# Patient Record
Sex: Male | Born: 1935
Health system: Southern US, Community
[De-identification: ages and names within clinical notes are randomized; demographics above are authoritative.]

## PROBLEM LIST (undated history)

## (undated) DIAGNOSIS — N400 Enlarged prostate without lower urinary tract symptoms: Secondary | ICD-10-CM

## (undated) DIAGNOSIS — I2699 Other pulmonary embolism without acute cor pulmonale: Secondary | ICD-10-CM

## (undated) DIAGNOSIS — G3184 Mild cognitive impairment, so stated: Secondary | ICD-10-CM

## (undated) DIAGNOSIS — S12090A Other displaced fracture of first cervical vertebra, initial encounter for closed fracture: Secondary | ICD-10-CM

## (undated) HISTORY — PX: URETHRA SURGERY: SHX824

---

## 1999-12-16 ENCOUNTER — Encounter: Admission: RE | Admit: 1999-12-16 | Discharge: 1999-12-16 | Payer: Self-pay | Admitting: Internal Medicine

## 1999-12-16 ENCOUNTER — Encounter: Payer: Self-pay | Admitting: Internal Medicine

## 1999-12-22 ENCOUNTER — Encounter: Payer: Self-pay | Admitting: Internal Medicine

## 1999-12-22 ENCOUNTER — Encounter: Admission: RE | Admit: 1999-12-22 | Discharge: 1999-12-22 | Payer: Self-pay | Admitting: Internal Medicine

## 2003-01-22 ENCOUNTER — Ambulatory Visit (HOSPITAL_COMMUNITY): Admission: RE | Admit: 2003-01-22 | Discharge: 2003-01-22 | Payer: Self-pay | Admitting: Internal Medicine

## 2010-05-25 DIAGNOSIS — Z862 Personal history of diseases of the blood and blood-forming organs and certain disorders involving the immune mechanism: Secondary | ICD-10-CM

## 2010-05-25 HISTORY — DX: Personal history of diseases of the blood and blood-forming organs and certain disorders involving the immune mechanism: Z86.2

## 2011-01-10 ENCOUNTER — Inpatient Hospital Stay (HOSPITAL_COMMUNITY): Payer: Medicare Other

## 2011-01-10 ENCOUNTER — Inpatient Hospital Stay (HOSPITAL_COMMUNITY)
Admission: AD | Admit: 2011-01-10 | Discharge: 2011-01-16 | DRG: 813 | Disposition: A | Discharge status: Home Health | Payer: Medicare Other | Source: Other Acute Inpatient Hospital | Attending: Internal Medicine | Admitting: Internal Medicine

## 2011-01-10 DIAGNOSIS — M549 Dorsalgia, unspecified: Secondary | ICD-10-CM | POA: Diagnosis present

## 2011-01-10 DIAGNOSIS — E139 Other specified diabetes mellitus without complications: Secondary | ICD-10-CM | POA: Diagnosis present

## 2011-01-10 DIAGNOSIS — R339 Retention of urine, unspecified: Secondary | ICD-10-CM | POA: Diagnosis present

## 2011-01-10 DIAGNOSIS — D693 Immune thrombocytopenic purpura: Secondary | ICD-10-CM

## 2011-01-10 DIAGNOSIS — D696 Thrombocytopenia, unspecified: Secondary | ICD-10-CM

## 2011-01-10 DIAGNOSIS — N35919 Unspecified urethral stricture, male, unspecified site: Secondary | ICD-10-CM | POA: Diagnosis present

## 2011-01-10 DIAGNOSIS — R310 Gross hematuria: Secondary | ICD-10-CM

## 2011-01-10 DIAGNOSIS — E871 Hypo-osmolality and hyponatremia: Secondary | ICD-10-CM | POA: Diagnosis not present

## 2011-01-10 DIAGNOSIS — D72829 Elevated white blood cell count, unspecified: Secondary | ICD-10-CM | POA: Diagnosis present

## 2011-01-10 DIAGNOSIS — W57XXXA Bitten or stung by nonvenomous insect and other nonvenomous arthropods, initial encounter: Secondary | ICD-10-CM | POA: Diagnosis present

## 2011-01-10 DIAGNOSIS — T148 Other injury of unspecified body region: Secondary | ICD-10-CM | POA: Diagnosis present

## 2011-01-10 DIAGNOSIS — D62 Acute posthemorrhagic anemia: Secondary | ICD-10-CM | POA: Diagnosis present

## 2011-01-10 DIAGNOSIS — R319 Hematuria, unspecified: Secondary | ICD-10-CM | POA: Diagnosis present

## 2011-01-10 DIAGNOSIS — N210 Calculus in bladder: Secondary | ICD-10-CM | POA: Diagnosis present

## 2011-01-10 DIAGNOSIS — R6510 Systemic inflammatory response syndrome (SIRS) of non-infectious origin without acute organ dysfunction: Secondary | ICD-10-CM | POA: Diagnosis present

## 2011-01-10 DIAGNOSIS — N179 Acute kidney failure, unspecified: Secondary | ICD-10-CM | POA: Diagnosis present

## 2011-01-10 DIAGNOSIS — N432 Other hydrocele: Secondary | ICD-10-CM | POA: Diagnosis present

## 2011-01-10 DIAGNOSIS — G8929 Other chronic pain: Secondary | ICD-10-CM | POA: Diagnosis present

## 2011-01-10 DIAGNOSIS — T380X5A Adverse effect of glucocorticoids and synthetic analogues, initial encounter: Secondary | ICD-10-CM | POA: Diagnosis present

## 2011-01-10 LAB — COMPREHENSIVE METABOLIC PANEL
ALT: 14 U/L (ref 0–53)
BUN: 25 mg/dL — ABNORMAL HIGH (ref 6–23)
CO2: 26 mEq/L (ref 19–32)
Calcium: 8.8 mg/dL (ref 8.4–10.5)
Creatinine, Ser: 1.18 mg/dL (ref 0.50–1.35)
GFR calc Af Amer: >60 mL/min (ref >60)
GFR calc non Af Amer: >60 mL/min (ref >60)
Glucose, Bld: 209 mg/dL — ABNORMAL HIGH (ref 70–99)
Total Protein: 6.3 g/dL (ref 6.0–8.3)

## 2011-01-10 LAB — GLUCOSE, CAPILLARY
Glucose-Capillary: 215 mg/dL — ABNORMAL HIGH (ref 70–99)
Glucose-Capillary: 157 mg/dL — ABNORMAL HIGH (ref 70–99)

## 2011-01-10 LAB — CBC
MCH: 33.4 pg (ref 26.0–34.0)
MCV: 93.6 fL (ref 78.0–100.0)
Platelets: 11 10*3/uL — CL (ref 150–400)
RBC: 3.14 MIL/uL — ABNORMAL LOW (ref 4.22–5.81)
RDW: 12.3 % (ref 11.5–15.5)
WBC: 22.8 10*3/uL — ABNORMAL HIGH (ref 4.0–10.5)

## 2011-01-10 LAB — ABO/RH: ABO/RH(D): A POS

## 2011-01-10 LAB — DIFFERENTIAL
Basophils Absolute: 0 10*3/uL (ref 0.0–0.1)
Basophils Relative: 0 % (ref 0–1)
Eosinophils Absolute: 0 10*3/uL (ref 0.0–0.7)
Eosinophils Relative: 0 % (ref 0–5)
Lymphocytes Relative: 3 % — ABNORMAL LOW (ref 12–46)
Lymphs Abs: 0.6 10*3/uL — ABNORMAL LOW (ref 0.7–4.0)
Monocytes Absolute: 1.3 10*3/uL — ABNORMAL HIGH (ref 0.1–1.0)
Monocytes Relative: 6 % (ref 3–12)
Neutro Abs: 20.9 10*3/uL — ABNORMAL HIGH (ref 1.7–7.7)
Neutrophils Relative %: 92 % — ABNORMAL HIGH (ref 43–77)

## 2011-01-10 LAB — MRSA PCR SCREENING: MRSA by PCR: NEGATIVE

## 2011-01-10 LAB — PROTIME-INR
INR: 1.12 (ref 0.00–1.49)
Prothrombin Time: 14.6 seconds (ref 11.6–15.2)

## 2011-01-11 DIAGNOSIS — R310 Gross hematuria: Secondary | ICD-10-CM

## 2011-01-11 DIAGNOSIS — D696 Thrombocytopenia, unspecified: Secondary | ICD-10-CM

## 2011-01-11 DIAGNOSIS — A799 Rickettsiosis, unspecified: Secondary | ICD-10-CM

## 2011-01-11 DIAGNOSIS — D693 Immune thrombocytopenic purpura: Secondary | ICD-10-CM

## 2011-01-11 LAB — URINALYSIS, ROUTINE W REFLEX MICROSCOPIC
Ketones, ur: 40 mg/dL — AB
Nitrite: POSITIVE — AB
pH: 5 (ref 5.0–8.0)

## 2011-01-11 LAB — URINE MICROSCOPIC-ADD ON

## 2011-01-11 LAB — CBC
HCT: 29.4 % — ABNORMAL LOW (ref 39.0–52.0)
Hemoglobin: 10.3 g/dL — ABNORMAL LOW (ref 13.0–17.0)
MCH: 33 pg (ref 26.0–34.0)
MCHC: 35 g/dL (ref 30.0–36.0)
RDW: 12.4 % (ref 11.5–15.5)

## 2011-01-11 LAB — HEMOGLOBIN A1C: Mean Plasma Glucose: 128 mg/dL — ABNORMAL HIGH (ref <117)

## 2011-01-11 LAB — BASIC METABOLIC PANEL
BUN: 19 mg/dL (ref 6–23)
Calcium: 8.8 mg/dL (ref 8.4–10.5)
GFR calc non Af Amer: >60 mL/min (ref >60)
Glucose, Bld: 165 mg/dL — ABNORMAL HIGH (ref 70–99)
Sodium: 138 mEq/L (ref 135–145)

## 2011-01-11 LAB — GLUCOSE, CAPILLARY: Glucose-Capillary: 141 mg/dL — ABNORMAL HIGH (ref 70–99)

## 2011-01-12 DIAGNOSIS — D6949 Other primary thrombocytopenia: Secondary | ICD-10-CM

## 2011-01-12 DIAGNOSIS — A799 Rickettsiosis, unspecified: Secondary | ICD-10-CM

## 2011-01-12 DIAGNOSIS — D649 Anemia, unspecified: Secondary | ICD-10-CM

## 2011-01-12 LAB — DIFFERENTIAL
Lymphocytes Relative: 3 % — ABNORMAL LOW (ref 12–46)
Lymphs Abs: 0.7 10*3/uL (ref 0.7–4.0)
Neutro Abs: 17.8 10*3/uL — ABNORMAL HIGH (ref 1.7–7.7)
Neutrophils Relative %: 92 % — ABNORMAL HIGH (ref 43–77)

## 2011-01-12 LAB — GLUCOSE, CAPILLARY: Glucose-Capillary: 153 mg/dL — ABNORMAL HIGH (ref 70–99)

## 2011-01-12 LAB — CBC
HCT: 24 % — ABNORMAL LOW (ref 39.0–52.0)
Hemoglobin: 8.4 g/dL — ABNORMAL LOW (ref 13.0–17.0)
MCV: 94.1 fL (ref 78.0–100.0)
Platelets: <5 10*3/uL — CL (ref 150–400)
RBC: 2.55 MIL/uL — ABNORMAL LOW (ref 4.22–5.81)
WBC: 19.3 10*3/uL — ABNORMAL HIGH (ref 4.0–10.5)

## 2011-01-13 LAB — GLUCOSE, CAPILLARY
Glucose-Capillary: 168 mg/dL — ABNORMAL HIGH (ref 70–99)
Glucose-Capillary: 159 mg/dL — ABNORMAL HIGH (ref 70–99)
Glucose-Capillary: 160 mg/dL — ABNORMAL HIGH (ref 70–99)

## 2011-01-13 LAB — DIFFERENTIAL
Basophils Absolute: 0 10*3/uL (ref 0.0–0.1)
Lymphocytes Relative: 5 % — ABNORMAL LOW (ref 12–46)
Lymphs Abs: 1 10*3/uL (ref 0.7–4.0)
Monocytes Relative: 5 % (ref 3–12)
Neutrophils Relative %: 90 % — ABNORMAL HIGH (ref 43–77)

## 2011-01-13 LAB — CBC
Hemoglobin: 7 g/dL — ABNORMAL LOW (ref 13.0–17.0)
MCHC: 36.5 g/dL — ABNORMAL HIGH (ref 30.0–36.0)
WBC: 19.8 10*3/uL — ABNORMAL HIGH (ref 4.0–10.5)

## 2011-01-13 LAB — B. BURGDORFI ANTIBODIES: B burgdorferi Ab IgG+IgM: 0.49 {ISR}

## 2011-01-14 LAB — GLUCOSE, CAPILLARY
Glucose-Capillary: 141 mg/dL — ABNORMAL HIGH (ref 70–99)
Glucose-Capillary: 125 mg/dL — ABNORMAL HIGH (ref 70–99)
Glucose-Capillary: 149 mg/dL — ABNORMAL HIGH (ref 70–99)

## 2011-01-14 LAB — TYPE AND SCREEN: Unit division: 0

## 2011-01-14 LAB — BASIC METABOLIC PANEL
CO2: 26 mEq/L (ref 19–32)
Chloride: 101 mEq/L (ref 96–112)
Sodium: 131 mEq/L — ABNORMAL LOW (ref 135–145)

## 2011-01-14 LAB — URINE CULTURE: Culture  Setup Time: 201208211609

## 2011-01-14 LAB — DIFFERENTIAL
Basophils Relative: 0 % (ref 0–1)
Eosinophils Absolute: 0 10*3/uL (ref 0.0–0.7)
Neutrophils Relative %: 87 % — ABNORMAL HIGH (ref 43–77)

## 2011-01-14 LAB — CBC
Platelets: 27 10*3/uL — CL (ref 150–400)
RBC: 2.18 MIL/uL — ABNORMAL LOW (ref 4.22–5.81)
WBC: 15.6 10*3/uL — ABNORMAL HIGH (ref 4.0–10.5)

## 2011-01-15 DIAGNOSIS — D693 Immune thrombocytopenic purpura: Secondary | ICD-10-CM

## 2011-01-15 LAB — DIFFERENTIAL
Basophils Relative: 0 % (ref 0–1)
Eosinophils Absolute: 0 10*3/uL (ref 0.0–0.7)
Eosinophils Relative: 0 % (ref 0–5)
Monocytes Relative: 6 % (ref 3–12)
Neutrophils Relative %: 87 % — ABNORMAL HIGH (ref 43–77)

## 2011-01-15 LAB — GLUCOSE, CAPILLARY
Glucose-Capillary: 155 mg/dL — ABNORMAL HIGH (ref 70–99)
Glucose-Capillary: 136 mg/dL — ABNORMAL HIGH (ref 70–99)

## 2011-01-15 LAB — BASIC METABOLIC PANEL
CO2: 27 mEq/L (ref 19–32)
Calcium: 8.1 mg/dL — ABNORMAL LOW (ref 8.4–10.5)
GFR calc Af Amer: >60 mL/min (ref >60)
GFR calc non Af Amer: >60 mL/min (ref >60)
Sodium: 132 mEq/L — ABNORMAL LOW (ref 135–145)

## 2011-01-15 LAB — CBC
MCH: 33.2 pg (ref 26.0–34.0)
Platelets: 80 10*3/uL — ABNORMAL LOW (ref 150–400)
RBC: 2.41 MIL/uL — ABNORMAL LOW (ref 4.22–5.81)
RDW: 13.7 % (ref 11.5–15.5)

## 2011-01-16 LAB — DIFFERENTIAL
Basophils Absolute: 0 10*3/uL (ref 0.0–0.1)
Eosinophils Absolute: 0 10*3/uL (ref 0.0–0.7)
Lymphocytes Relative: 9 % — ABNORMAL LOW (ref 12–46)
Neutrophils Relative %: 81 % — ABNORMAL HIGH (ref 43–77)

## 2011-01-16 LAB — CBC
MCHC: 36.6 g/dL — ABNORMAL HIGH (ref 30.0–36.0)
RDW: 14.4 % (ref 11.5–15.5)

## 2011-01-16 LAB — GLUCOSE, CAPILLARY: Glucose-Capillary: 124 mg/dL — ABNORMAL HIGH (ref 70–99)

## 2011-01-17 LAB — CULTURE, BLOOD (ROUTINE X 2)
Culture  Setup Time: 201208191127
Culture: NO GROWTH

## 2011-01-28 NOTE — Discharge Summary (Signed)
NAME:  Randy Mason, Randy Mason NO.:  [id number]  MEDICAL RECORD NO.:  [id number]  LOCATION:                                 FACILITY:  PHYSICIAN:  Clydia Llano, MD       DATE OF BIRTH:  [birth year]  DATE OF ADMISSION: DATE OF DISCHARGE:                              DISCHARGE SUMMARY   PRIMARY CARE PHYSICIAN:  Dr. [name].  PRIMARY ONCOLOGIST:  Dr. Cleone Slim in [place] [facility] [place].  PRIMARY UROLOGIST:  Dr. Baldo Ash in [facility] in [place].  REASON FOR ADMISSION:  Severe thrombocytopenia.  DISCHARGE DIAGNOSES: 1. Acute idiopathic thrombocytopenic purpura, severe. 2. Systemic inflammatory response syndrome. 3. Suspected tick-borne illness. 4. Acute renal failure, resolved. 5. Hematuria secondary to thrombocytopenia and urethral stricture. 6. Acute blood loss anemia. 7. Urinary retention secondary to urethral stricture. 8. Steroid-induced hyperglycemia. 9. History of benign prostatic hyperplasia status post transurethral     resection of prostate.  DISCHARGE MEDICATIONS: 1. Mycelex 10 mg troches, dissolve in mouth three times daily. 2. Doxycycline 100 mg p.o. b.i.d., take for eight more days. 3. Iron complex 150 mg p.o. b.i.d. 4. Prednisone 40 mg p.o. b.i.d. at breakfast and lunch. 5. Multivitamin OTC one tablet p.o. daily.  RADIOLOGY:  Chest x-ray August 19 showed no acute cardiopulmonary process.  BRIEF HISTORY AND EXAMINATION:  Mr. Delman is a 77 year old male with history of BPH status post TURP.  The patient was presented to [facility], complaining about urinary retention for 1 week.  The patient was found to be thrombocytopenic, which was of unclear etiology.  The patient has recent tick bite.  After the patient has very difficult placement of Foley catheter secondary to urethral stricture, the patient underwent cystoscopy, placement of the Foley catheter, and the patient was transferred to [facility] for further evaluation.  CT scan  of abdomen and pelvis was done in [facility] revealed 1.8 cm bladder stone, which was unable to be removed by cystoscopy.  The patient upon arrival to the [facility], evaluated by Dr. Truett Perna from Hematology/Oncology.  The patient thrombocytopenia felt to be secondary to acute ITP possibly secondary to infectious etiology.  The patient was started on IV steroids and received IVIG, platelet count was slowly rising, and due to concern of tick-borne disease, ID was consulted and the patient was started on doxycycline.  CONSULTS: 1. Dr. Daiva Eves, Infectious Disease. 2. Dr. Thornton Papas, Hematology/Oncology.  BRIEF HOSPITAL COURSE: 1. Acute idiopathic thrombocytopenic purpura.  The patient was sent     here because of lack of Hematology Service at [facility] in     [place].  The patient received IVIG and steroids at admission.  It is     worth to mention that the platelet count was 4000 in [facility] and about the time he was presented to [facility] it was     11,000.  By the time, he presented to [facility] hospital, it was 5000.     Platelet count is steadily rising with IVIG and high-dose steroids.     The patient's steroids was transitioned to oral steroids after high     dose IV one.  The patient will  be going home on 40 mg of prednisone     twice a day.  The patient is to follow up with Dr. Cleone Slim, his     primary hematologist in [place], [place].  The patient also be     started on Mycelex troches to prevent yeast infection while he is     in the steroids. 2. Acute hematuria with urethral stricture.  The patient's urine     retention is prior to the procedure.  The patient evaluated by     Urology, Dr. Baldo Ash, [facility] with significant urethral     stricture associated with urinary retention for 7 days.  The     patient eventually placed Foley catheter, was much difficulty under     cystoscopy guidance.  The patient also had a bladder stone which      was not removed likely due to its size.  The patient's persistent     hematuria is likely secondary to the low platelets with the     difficult and rather traumatic insertion of the Foley catheter.     The blood was frank blood.  The patient was started on irrigation     in the hospital here.  At discharge, the patient recommended to     have the Foley catheter until he can tell he sees Dr. Cleone Slim. 3. Leukocytosis.  The patient is being pancultured on admission.     There is no actual organism identified, suspected this time     leukocytosis, will be secondary to steroid and stress     demargination. 4. Questionable tick-borne illness and SIRS.  The patient presented     with sign and symptoms consistent with SIRS by elevated lactic acid     level and mild acute renal failure.  Empirically started on     doxycycline due to concern of possible tick-borne illness.     Infectious Disease Service was consulted, recommended 10 days of     doxycycline, which is automatic, stop date is August 28.  His [facility] spotted fever and Lyme disease titers were negative. 5. Acute blood loss anemia secondary to hematuria and     thrombocytopenia.  The patient received 2 units of packed RBCs.     The patient did receive platelets at [facility] as well.  Hematology     started iron and the patient will go on that. 6. Mild hyponatremia, this is likely to be secondary to hyperglycemia,     which is steroids induced.  DISCHARGE INSTRUCTION AND DISPOSITION: 1. Home with please keep the Foley to his urologist. 2. Activity as tolerated. 3. Diet, regular diet.     Clydia Llano, MD     ME/MEDQ  D:  01/16/2011  T:  01/16/2011  Job:  [id number]  cc:   Erskine Speed, M.D. Lynett Fish, M.D. Beatris Ship, MD  Electronically Signed by Clydia Llano  on 01/28/2011 03:05:43 PM

## 2011-02-23 NOTE — Consult Note (Signed)
NAMEMarland Kitchen  GREYSEN, DEVINO NO.:  [id number]  MEDICAL RECORD NO.:  [id number]  LOCATION:  2102                         FACILITY:  [facility]  PHYSICIAN:  Ladene Artist, M.D.  DATE OF BIRTH:  1933/05/02  DATE OF CONSULTATION:  01/10/2011 DATE OF DISCHARGE:                                CONSULTATION   REFERRING PHYSICIAN:  Mcarthur Rossetti. Tyson Alias, MD  PATIENT IDENTIFICATION:  Mr. Lucarelli is a 77 year old transferred from [facility] with severe thrombocytopenia.  HISTORY OF PRESENT ILLNESS:  Mr. Colee presented to [facility] on January 09, 2011 with urinary retention and gross hematuria. He was noted to have severe thrombocytopenia on admission CBC with a platelet count of 4000.  He was transfused 2 units of platelets and the platelet count increased to 47,000.  Urology was consulted and he was taken to a cystoscopy procedure.  A bladder stone was found.  Due to the lack of available platelets/Hematology consultation at [facility], he was transferred to [facility].  Mr. Renstrom reports easy bruising for the past 1 month.  He has noted occasional gross hematuria.  There has been no other bleeding.  He developed a red/purple rash over the legs after admission to [facility].  He has been taking an over-the-counter medication for back pain relief. He reports taking this medication intermittently for several years.  He reports several recent "tick bites."  PAST MEDICAL HISTORY: 1. History of prostatic hypertrophy, status post a TUR procedure. 2. Chronic back pain.  ALLERGIES:  No known drug allergies.  MEDICATIONS ON ADMISSION: 1. Multivitamin. 2. "Natural back" medication  FAMILY HISTORY:  His brother has prostate cancer.  His mother had breast cancer.  No other family history of cancer.  SOCIAL HISTORY:  He is retired Retail buyer.  He does not use tobacco.  He drinks beer occasionally.  He has never been a heavy drinker.  He  denies risk factors for HIV and hepatitis.  REVIEW OF SYSTEMS:  CONSTITUTIONAL:  He denies fever and anorexia/weight loss.  GU:  He has noted occasional gross hematuria over the past month. HEMATOLOGIC:  He reports easy bruising for the past month. MUSCULOSKELETAL:  He reports chronic intermittent low back pain.  This has improved recently.  A complete review of review of systems is otherwise negative.  PHYSICAL EXAMINATION:  VITAL SIGNS:  Blood pressure 129/64, pulse 97, and temperature 98.6. HEENT:  Very few small ecchymoses at the buccal mucosa and pharynx.  No active bleeding.  No thrush.  No ulcers. NECK:  Without mass. LUNGS:  Clear bilaterally. CARDIAC:  Regular rhythm. ABDOMEN:  Nontender.  No hepatosplenomegaly. GU:  There is a Foley catheter in place with bloody urine.  There is bright red blood oozing from the Foley exit at the penis.  There is a soft fullness in the left testicle/scrotum. LYMPH NODES:  No cervical, clavicular, axillary, or inguinal nodes. SKIN:  There is a petechial rash over the legs.  There are small ecchymoses over the arms. NEUROLOGIC:  He is alert and oriented.  Motor exam is grossly intact.  LABORATORY DATA:  Potassium 4.4, BUN 25, creatinine 1.18, calcium 8.8, and albumin 3.4.  Hemoglobin 10.5, white count 22.8 with 92%  neutrophils, and platelets 11,000.  PT 14.6 and PTT 27.  Review of peripheral blood smear:  The platelets are markedly decreased.  There are approximately 1-2 platelets per high-powered field.  There is a rare giant platelet.  The red cell morphology is unremarkable.  There are no schistocytes.  There is a marked increase in neutrophils.  There are moderate number of band forms.  No blasts.  IMPRESSION: 1. Severe thrombocytopenia. 2. Anemia - likely secondary to bleeding. 3. History of prostatic hypertrophy, status post transurethral     resection. 4. Bladder stone 5. Gross hematuria secondary to the bladder stone and  severe     thrombocytopenia. 6. Left testicular hydrocele. 7. Neutrophilia - likely secondary to steroids and an acute phase     reaction.  Mr. Davitt presents with severe thrombocytopenia and gross hematuria. The most likely diagnosis is idiopathic thrombocytopenia purpura.  There is no clinical evidence for an underlying hematopoietic malignancy, collagen vascular disease, or acute infection to cause this degree of thrombocytopenia.  However, he reports a recent tick bite and he has significant neutrophilia with an increased number of bands.  I would therefore consider the possibility of an associated infection.  His history of easy bruising over the past month is most suggestive of ITP.  RECOMMENDATIONS: 1. Continue Solu-Medrol. 2. Daily platelet count. 3. IVIG if the platelets do not improve over the next 24 hours or if     he develops more significant bleeding. 4. Consider broad-spectrum antibiotics and obtaining cultures with the     possibility of an underlying systemic infection.  Hematology will continue following him while in the hospital and then as an outpatient.     Ladene Artist, M.D.     GBS/MEDQ  D:  01/10/2011  T:  01/10/2011  Job:  [id number]  Electronically Signed by Thornton Papas M.D. on 02/23/2011 08:40:07 AM

## 2011-02-25 NOTE — Progress Notes (Signed)
NAMEMarland Kitchen  NAYTHEN, HEIKKILA NO.:  [id number]  MEDICAL RECORD NO.:  [id number]  LOCATION:  3303                         FACILITY:  [facility]  PHYSICIAN:  Thad Ranger, MD       DATE OF BIRTH:  Jun 06, 1932                                PROGRESS NOTE   ADMITTING PHYSICIAN: Dr. Tyson Alias with Pulmonary Critical Care Medicine.  CONSULTANTS THIS ADMISSION: Dr. Truett Perna with Hematology and Dr. Paulette Blanch Dam with Infectious Disease Services.  PRIMARY CARE PHYSICIAN: Dr. [name] in [place].  PRIMARY HEMATOLOGIST/ONCOLOGIST: Dr. Cleone Slim.  The patient also has a primary urologist in [place].  He did see Dr. Baldo Ash while at [facility].  CHIEF COMPLAINT/REASON FOR ADMISSION: Mr. Sanna is a 77 year old male patient, known history of benign proosatitc hypertrophy status post TURP, presented to [facility] with reports of urinary retention x1 week, was found to have thrombocytopenic with platelet count 4000.  He was transfused 2 units of platelets with platelet count increasing to 47,000.  While at [facility] due to the urinary retention, Urology was consulted.  A Foley catheter was placed after much difficulty by the urologist, Dr. Baldo Ash, and the patient was subsequently taken to cystoscopy.  A bladder stone was found, was unable to be removed.  Due to the lack of available platelets and Hematology consultation at [facility], he was subsequently transferred to [facility] for further evaluation.  While at [facility], he did undergo a CT of the abdomen and pelvis, which again revealed a 1.8 cm bladder stone, which according to patient is new.  Since arrival to [facility], he was evaluated by the Hematology physician here, who felt the patient had ITP most likely due to infectious etiology.  He was subsequent started on IV steroids and has been receiving IVIG as well.  There were also concerns that the patient may have an infectious etiology related to  tick-borne illness.  Infectious Disease Service was consulted and the patient was placed on empiric doxycycline.  PAST MEDICAL HISTORY: 1. BPH status post TURP. 2. Chronic back pain.  ADMITTING DIAGNOSES: 1. Acute idiopathic thrombocytopenic purpura, severe. 2. Systemic inflammatory response syndrome. 3. Suspected tick-borne illness given thrombocytopenia, petechial     rash, and risk factors. 4. Acute renal failure. 5. Hematuria secondary to thrombocytopenia. 6. Acute anemia/blood loss secondary to thrombocytopenia and     hematuria. 7. Urinary retention at presentation to [facility] with associated     urethral stricture, Foley catheter in place. 8. Hyperglycemia without diabetes suspected related to steroid     therapy.  DIAGNOSTICS: Portable chest x-ray, August 19 that shows no acute cardiopulmonary process seen, studies suboptimal due to limitations in patient positioning.  LABORATORY: MRSA PCR screening was negative.  At presentation, PT 14.6, INR 1.12, and PTT 27.  Lactic acid 2.7.  Creatinine was 1.18, BUN 25, glucose 209. Sodium 136, potassium 4.4, total bilirubin was 1.6 with otherwise normal LFTs.  HIV antibody was nonreactive.  Hemoglobin A1c was 6.1. Urinalysis at presentation showed cloudy appearance, large bilirubin, 40 ketones, large blood, greater than 300 protein, nitrite positive, leukocytes large, squamous epithelials rare, wbc 3-6, bacterial rare, no culture was obtained on this sample.  Blood cultures have been checked this  admission and are negative.  [facility] spotted fever.  Titer was normal.  Lyme disease titer was normal.  Repeat urine culture on August 21 shows no growth.  As of today, white count 19,800, hemoglobin 8, hematocrit 22.4, platelets of 80,000, neutrophils 87%, absolute neutrophils 17.3%.  Sodium 132, potassium 4, chloride 102, CO2 27, glucose 163, BUN 30, creatinine 0.88.  HOSPITAL COURSE: 1. Acute idiopathic thrombocytopenia  purpura.  The patient was sent     over here because of lack of available platelets as well as lack     of acute Hematology Services at [facility].  Platelet     count at presentation to [facility] was 4000, they were up to     11,000, by the time, he presented to [facility] on 8/18 with a nadir of     less than 5,000 during this admission. Most recent platelet count     today is up to 80,000.  This is after appropriate treatment with IV     steroids and a total of 5 days of IVIG.  Hematology has been     following with this.  As of today, the patient has been     transitioned over oral steroids and will continue these after     discharge home.  He is to follow up with Dr. Cleone Slim his primary     oncologist/hematologist after discharge.  Hematology has also     started Mycelex troches to prevent oral yeast infection while on     steroids. 2. SIRS and questionable tick-borne illness.  The patient presented     with signs and symptoms consistent with SIRS, elevated lactic acid     level, and mild acute renal failure.  He was empirically started on     doxycycline due to concerns of possible tick-borne illness.     Infectious Disease Services were consulted.  Recommended 10 days of     doxycycline with an automatic stop date of January 20, 2011.  His     RMSF and Lyme titer were negative.  ID has signed off. 3. Acute hematuria in the setting of history of urethral stricture and     urinary retention for 7 days prior to admission.  The patient was     evaluated by Urology, Dr. Baldo Ash at [facility] and had significant     urethral stricture with associated urinary retention x7 days at     presentation.  Dr. Baldo Ash eventually placed a Foley with much     difficulty.  The patient underwent cystoscopy at [facility].     There was also a bladder stone, which was not removed.  Due to     persistent hematuria with low platelet count, the patient was     placed on a irrigation Foley set up which remains  in place.  Urine     at this point is more rusty in color and is not as frank bloody and     no clots.  We will go ahead and continue the bladder irrigation for     several more days.  The patient prefers to follow up with primary     urologist in [place], but recommend due to the urethral stricture on     presentation with significant urinary retention for 7 days, he     continue the Foley at discharge. 4. Leukocytosis.  The patient has been pancultured this admission with     no actual organisms identified.  He has  not had any fever.  Suspect     at this time that the leukocytosis is due to stress demargination     in the steroids.  Repeat urine culture from August 21 shows no     growth. 5. Hyperglycemia secondary to steroids.  The patient has no past     medical history of diabetes.  His CBGs have ranged in the 150-250     range since admission.  We need to watch his sugars once he     transitions of the oral steroids since he may need diabetic-type     coverage with medication at discharge since he will discharge home     on prednisone. 6. Acute blood loss anemia secondary to hematuria and     thrombocytopenia.  The patient has received 2 units of packed red     blood cells this admission and did receive platelets at [facility] as     well.  Hematology is starting iron today.  The patient's hemoglobin     is stable. 7. Mild hyponatremia.  Suspect this is related to hyperglycemia and     steroids.  DISPOSITION: At the present time, the patient is appropriate to transfer to a non- telemetry floor.     Allison L. Rennis Harding, N.P.   ______________________________ Thad Ranger, MD    ALE/MEDQ  D:  01/15/2011  T:  01/15/2011  Job:  [id number]  Electronically Signed by Junious Silk N.P. on 01/15/2011 01:37:43 PM Electronically Signed by Andres Labrum RAI  on 02/25/2011 03:23:08 PM

## 2011-08-05 ENCOUNTER — Other Ambulatory Visit (HOSPITAL_COMMUNITY): Payer: Self-pay | Admitting: Urology

## 2011-08-05 DIAGNOSIS — N400 Enlarged prostate without lower urinary tract symptoms: Secondary | ICD-10-CM

## 2011-08-05 DIAGNOSIS — N433 Hydrocele, unspecified: Secondary | ICD-10-CM

## 2011-08-07 ENCOUNTER — Ambulatory Visit (HOSPITAL_COMMUNITY)
Admission: RE | Admit: 2011-08-07 | Discharge: 2011-08-07 | Disposition: A | Discharge status: Home / Self Care | Payer: Medicare Other | Source: Ambulatory Visit | Attending: Urology | Admitting: Urology

## 2011-08-07 ENCOUNTER — Other Ambulatory Visit (HOSPITAL_COMMUNITY): Payer: Self-pay | Admitting: Urology

## 2011-08-07 DIAGNOSIS — N400 Enlarged prostate without lower urinary tract symptoms: Secondary | ICD-10-CM | POA: Insufficient documentation

## 2011-08-07 DIAGNOSIS — N433 Hydrocele, unspecified: Secondary | ICD-10-CM

## 2011-08-07 DIAGNOSIS — R9389 Abnormal findings on diagnostic imaging of other specified body structures: Secondary | ICD-10-CM | POA: Insufficient documentation

## 2011-10-08 ENCOUNTER — Encounter: Payer: Medicare Other | Admitting: Internal Medicine

## 2011-10-08 DIAGNOSIS — D693 Immune thrombocytopenic purpura: Secondary | ICD-10-CM

## 2014-08-27 DIAGNOSIS — N390 Urinary tract infection, site not specified: Secondary | ICD-10-CM | POA: Diagnosis not present

## 2014-08-27 DIAGNOSIS — N209 Urinary calculus, unspecified: Secondary | ICD-10-CM | POA: Diagnosis not present

## 2014-09-10 DIAGNOSIS — N390 Urinary tract infection, site not specified: Secondary | ICD-10-CM | POA: Diagnosis not present

## 2014-10-15 DIAGNOSIS — R319 Hematuria, unspecified: Secondary | ICD-10-CM | POA: Diagnosis not present

## 2014-10-15 DIAGNOSIS — N401 Enlarged prostate with lower urinary tract symptoms: Secondary | ICD-10-CM | POA: Diagnosis not present

## 2014-10-15 DIAGNOSIS — R3915 Urgency of urination: Secondary | ICD-10-CM | POA: Diagnosis not present

## 2014-10-15 DIAGNOSIS — R3919 Other difficulties with micturition: Secondary | ICD-10-CM | POA: Diagnosis not present

## 2014-10-24 DIAGNOSIS — N400 Enlarged prostate without lower urinary tract symptoms: Secondary | ICD-10-CM | POA: Diagnosis not present

## 2014-10-24 DIAGNOSIS — R319 Hematuria, unspecified: Secondary | ICD-10-CM | POA: Diagnosis not present

## 2014-11-15 DIAGNOSIS — R8290 Unspecified abnormal findings in urine: Secondary | ICD-10-CM | POA: Diagnosis not present

## 2014-11-15 DIAGNOSIS — N401 Enlarged prostate with lower urinary tract symptoms: Secondary | ICD-10-CM | POA: Diagnosis not present

## 2014-11-15 DIAGNOSIS — R319 Hematuria, unspecified: Secondary | ICD-10-CM | POA: Diagnosis not present

## 2014-11-15 DIAGNOSIS — R3919 Other difficulties with micturition: Secondary | ICD-10-CM | POA: Diagnosis not present

## 2014-11-20 DIAGNOSIS — R319 Hematuria, unspecified: Secondary | ICD-10-CM | POA: Diagnosis not present

## 2014-12-13 DIAGNOSIS — R3919 Other difficulties with micturition: Secondary | ICD-10-CM | POA: Diagnosis not present

## 2014-12-13 DIAGNOSIS — R8290 Unspecified abnormal findings in urine: Secondary | ICD-10-CM | POA: Diagnosis not present

## 2014-12-13 DIAGNOSIS — R319 Hematuria, unspecified: Secondary | ICD-10-CM | POA: Diagnosis not present

## 2014-12-13 DIAGNOSIS — R3915 Urgency of urination: Secondary | ICD-10-CM | POA: Diagnosis not present

## 2014-12-17 DIAGNOSIS — R319 Hematuria, unspecified: Secondary | ICD-10-CM | POA: Diagnosis not present

## 2015-01-04 DIAGNOSIS — N401 Enlarged prostate with lower urinary tract symptoms: Secondary | ICD-10-CM | POA: Diagnosis not present

## 2015-01-04 DIAGNOSIS — R8290 Unspecified abnormal findings in urine: Secondary | ICD-10-CM | POA: Diagnosis not present

## 2015-02-04 DIAGNOSIS — Z23 Encounter for immunization: Secondary | ICD-10-CM | POA: Diagnosis not present

## 2015-02-12 DIAGNOSIS — I10 Essential (primary) hypertension: Secondary | ICD-10-CM | POA: Diagnosis not present

## 2015-02-12 DIAGNOSIS — N400 Enlarged prostate without lower urinary tract symptoms: Secondary | ICD-10-CM | POA: Diagnosis not present

## 2015-02-12 DIAGNOSIS — N209 Urinary calculus, unspecified: Secondary | ICD-10-CM | POA: Diagnosis not present

## 2015-04-03 DIAGNOSIS — N209 Urinary calculus, unspecified: Secondary | ICD-10-CM | POA: Diagnosis not present

## 2015-04-03 DIAGNOSIS — N390 Urinary tract infection, site not specified: Secondary | ICD-10-CM | POA: Diagnosis not present

## 2015-05-16 DIAGNOSIS — N390 Urinary tract infection, site not specified: Secondary | ICD-10-CM | POA: Diagnosis not present

## 2015-07-08 DIAGNOSIS — J209 Acute bronchitis, unspecified: Secondary | ICD-10-CM | POA: Diagnosis not present

## 2015-07-15 DIAGNOSIS — J209 Acute bronchitis, unspecified: Secondary | ICD-10-CM | POA: Diagnosis not present

## 2015-07-31 DIAGNOSIS — R799 Abnormal finding of blood chemistry, unspecified: Secondary | ICD-10-CM | POA: Diagnosis not present

## 2015-07-31 DIAGNOSIS — Z0000 Encounter for general adult medical examination without abnormal findings: Secondary | ICD-10-CM | POA: Diagnosis not present

## 2015-07-31 DIAGNOSIS — N400 Enlarged prostate without lower urinary tract symptoms: Secondary | ICD-10-CM | POA: Diagnosis not present

## 2015-07-31 DIAGNOSIS — E538 Deficiency of other specified B group vitamins: Secondary | ICD-10-CM | POA: Diagnosis not present

## 2015-09-17 DIAGNOSIS — H1132 Conjunctival hemorrhage, left eye: Secondary | ICD-10-CM | POA: Diagnosis not present

## 2015-10-14 DIAGNOSIS — L820 Inflamed seborrheic keratosis: Secondary | ICD-10-CM | POA: Diagnosis not present

## 2015-10-14 DIAGNOSIS — I781 Nevus, non-neoplastic: Secondary | ICD-10-CM | POA: Diagnosis not present

## 2015-10-27 DIAGNOSIS — D696 Thrombocytopenia, unspecified: Secondary | ICD-10-CM | POA: Diagnosis not present

## 2015-10-27 DIAGNOSIS — N410 Acute prostatitis: Secondary | ICD-10-CM | POA: Diagnosis not present

## 2015-10-27 DIAGNOSIS — N3001 Acute cystitis with hematuria: Secondary | ICD-10-CM | POA: Diagnosis not present

## 2015-10-27 DIAGNOSIS — R319 Hematuria, unspecified: Secondary | ICD-10-CM | POA: Diagnosis not present

## 2015-11-11 DIAGNOSIS — N410 Acute prostatitis: Secondary | ICD-10-CM | POA: Diagnosis not present

## 2015-11-11 DIAGNOSIS — N400 Enlarged prostate without lower urinary tract symptoms: Secondary | ICD-10-CM | POA: Diagnosis not present

## 2016-01-09 DIAGNOSIS — R319 Hematuria, unspecified: Secondary | ICD-10-CM | POA: Diagnosis not present

## 2016-01-09 DIAGNOSIS — N390 Urinary tract infection, site not specified: Secondary | ICD-10-CM | POA: Diagnosis not present

## 2016-01-16 DIAGNOSIS — N390 Urinary tract infection, site not specified: Secondary | ICD-10-CM | POA: Diagnosis not present

## 2016-02-04 DIAGNOSIS — N209 Urinary calculus, unspecified: Secondary | ICD-10-CM | POA: Diagnosis not present

## 2016-02-04 DIAGNOSIS — N390 Urinary tract infection, site not specified: Secondary | ICD-10-CM | POA: Diagnosis not present

## 2016-02-04 DIAGNOSIS — Z23 Encounter for immunization: Secondary | ICD-10-CM | POA: Diagnosis not present

## 2016-02-04 DIAGNOSIS — N400 Enlarged prostate without lower urinary tract symptoms: Secondary | ICD-10-CM | POA: Diagnosis not present

## 2016-03-11 DIAGNOSIS — R319 Hematuria, unspecified: Secondary | ICD-10-CM | POA: Diagnosis not present

## 2016-03-11 DIAGNOSIS — N390 Urinary tract infection, site not specified: Secondary | ICD-10-CM | POA: Diagnosis not present

## 2016-03-11 DIAGNOSIS — R300 Dysuria: Secondary | ICD-10-CM | POA: Diagnosis not present

## 2016-05-05 DIAGNOSIS — R8290 Unspecified abnormal findings in urine: Secondary | ICD-10-CM | POA: Diagnosis not present

## 2016-05-05 DIAGNOSIS — N451 Epididymitis: Secondary | ICD-10-CM | POA: Diagnosis not present

## 2017-07-16 ENCOUNTER — Ambulatory Visit: Payer: Medicare Other | Admitting: Urology

## 2017-07-16 DIAGNOSIS — R310 Gross hematuria: Secondary | ICD-10-CM

## 2017-07-16 DIAGNOSIS — N401 Enlarged prostate with lower urinary tract symptoms: Secondary | ICD-10-CM | POA: Diagnosis not present

## 2017-10-06 ENCOUNTER — Ambulatory Visit (INDEPENDENT_AMBULATORY_CARE_PROVIDER_SITE_OTHER): Payer: Medicare Other | Admitting: Urology

## 2017-10-06 ENCOUNTER — Other Ambulatory Visit (HOSPITAL_COMMUNITY)
Admission: AD | Admit: 2017-10-06 | Discharge: 2017-10-06 | Disposition: A | Discharge status: Home / Self Care | Payer: Medicare Other | Source: Other Acute Inpatient Hospital | Attending: Urology | Admitting: Urology

## 2017-10-06 DIAGNOSIS — R310 Gross hematuria: Secondary | ICD-10-CM

## 2017-10-06 DIAGNOSIS — N401 Enlarged prostate with lower urinary tract symptoms: Secondary | ICD-10-CM

## 2017-10-09 LAB — URINE CULTURE

## 2018-04-13 ENCOUNTER — Ambulatory Visit: Payer: Medicare Other | Admitting: Urology

## 2018-04-13 DIAGNOSIS — N401 Enlarged prostate with lower urinary tract symptoms: Secondary | ICD-10-CM

## 2018-04-13 DIAGNOSIS — R310 Gross hematuria: Secondary | ICD-10-CM | POA: Diagnosis not present

## 2018-12-28 ENCOUNTER — Ambulatory Visit: Payer: Medicare Other | Admitting: Urology

## 2019-01-04 ENCOUNTER — Ambulatory Visit (INDEPENDENT_AMBULATORY_CARE_PROVIDER_SITE_OTHER): Payer: Medicare Other | Admitting: Urology

## 2019-01-04 ENCOUNTER — Other Ambulatory Visit: Payer: Self-pay

## 2019-01-04 ENCOUNTER — Ambulatory Visit: Payer: Medicare Other | Admitting: Urology

## 2019-01-04 DIAGNOSIS — R310 Gross hematuria: Secondary | ICD-10-CM | POA: Diagnosis not present

## 2019-01-04 DIAGNOSIS — N401 Enlarged prostate with lower urinary tract symptoms: Secondary | ICD-10-CM

## 2019-05-30 DIAGNOSIS — I2699 Other pulmonary embolism without acute cor pulmonale: Secondary | ICD-10-CM | POA: Diagnosis not present

## 2019-05-30 DIAGNOSIS — Z789 Other specified health status: Secondary | ICD-10-CM | POA: Diagnosis not present

## 2019-05-30 DIAGNOSIS — I82409 Acute embolism and thrombosis of unspecified deep veins of unspecified lower extremity: Secondary | ICD-10-CM | POA: Diagnosis not present

## 2019-05-30 DIAGNOSIS — Z299 Encounter for prophylactic measures, unspecified: Secondary | ICD-10-CM | POA: Diagnosis not present

## 2019-05-30 DIAGNOSIS — R350 Frequency of micturition: Secondary | ICD-10-CM | POA: Diagnosis not present

## 2019-06-23 DIAGNOSIS — Z0000 Encounter for general adult medical examination without abnormal findings: Secondary | ICD-10-CM | POA: Diagnosis not present

## 2019-06-23 DIAGNOSIS — R5383 Other fatigue: Secondary | ICD-10-CM | POA: Diagnosis not present

## 2019-06-23 DIAGNOSIS — Z7189 Other specified counseling: Secondary | ICD-10-CM | POA: Diagnosis not present

## 2019-06-23 DIAGNOSIS — Z1211 Encounter for screening for malignant neoplasm of colon: Secondary | ICD-10-CM | POA: Diagnosis not present

## 2019-06-23 DIAGNOSIS — Z299 Encounter for prophylactic measures, unspecified: Secondary | ICD-10-CM | POA: Diagnosis not present

## 2019-06-23 DIAGNOSIS — Z79899 Other long term (current) drug therapy: Secondary | ICD-10-CM | POA: Diagnosis not present

## 2019-06-29 DIAGNOSIS — R413 Other amnesia: Secondary | ICD-10-CM | POA: Diagnosis not present

## 2019-07-21 DIAGNOSIS — Z789 Other specified health status: Secondary | ICD-10-CM | POA: Diagnosis not present

## 2019-07-21 DIAGNOSIS — Z299 Encounter for prophylactic measures, unspecified: Secondary | ICD-10-CM | POA: Diagnosis not present

## 2019-07-21 DIAGNOSIS — R350 Frequency of micturition: Secondary | ICD-10-CM | POA: Diagnosis not present

## 2019-07-21 DIAGNOSIS — R413 Other amnesia: Secondary | ICD-10-CM | POA: Diagnosis not present

## 2019-07-21 DIAGNOSIS — I2699 Other pulmonary embolism without acute cor pulmonale: Secondary | ICD-10-CM | POA: Diagnosis not present

## 2019-09-22 DIAGNOSIS — R413 Other amnesia: Secondary | ICD-10-CM | POA: Diagnosis not present

## 2019-09-22 DIAGNOSIS — Z713 Dietary counseling and surveillance: Secondary | ICD-10-CM | POA: Diagnosis not present

## 2019-09-22 DIAGNOSIS — Z299 Encounter for prophylactic measures, unspecified: Secondary | ICD-10-CM | POA: Diagnosis not present

## 2019-10-12 DIAGNOSIS — H16202 Unspecified keratoconjunctivitis, left eye: Secondary | ICD-10-CM | POA: Diagnosis not present

## 2019-10-31 DIAGNOSIS — H532 Diplopia: Secondary | ICD-10-CM | POA: Diagnosis not present

## 2019-10-31 DIAGNOSIS — H0102B Squamous blepharitis left eye, upper and lower eyelids: Secondary | ICD-10-CM | POA: Diagnosis not present

## 2019-10-31 DIAGNOSIS — H2513 Age-related nuclear cataract, bilateral: Secondary | ICD-10-CM | POA: Diagnosis not present

## 2019-10-31 DIAGNOSIS — H5051 Esophoria: Secondary | ICD-10-CM | POA: Diagnosis not present

## 2019-10-31 DIAGNOSIS — H0102A Squamous blepharitis right eye, upper and lower eyelids: Secondary | ICD-10-CM | POA: Diagnosis not present

## 2019-11-09 DIAGNOSIS — H43393 Other vitreous opacities, bilateral: Secondary | ICD-10-CM | POA: Diagnosis not present

## 2020-02-12 DIAGNOSIS — Z86711 Personal history of pulmonary embolism: Secondary | ICD-10-CM | POA: Diagnosis not present

## 2020-02-12 DIAGNOSIS — Z299 Encounter for prophylactic measures, unspecified: Secondary | ICD-10-CM | POA: Diagnosis not present

## 2020-02-12 DIAGNOSIS — M19012 Primary osteoarthritis, left shoulder: Secondary | ICD-10-CM | POA: Diagnosis not present

## 2020-02-12 DIAGNOSIS — M25512 Pain in left shoulder: Secondary | ICD-10-CM | POA: Diagnosis not present

## 2020-02-12 DIAGNOSIS — M25519 Pain in unspecified shoulder: Secondary | ICD-10-CM | POA: Diagnosis not present

## 2020-03-22 DIAGNOSIS — G300 Alzheimer's disease with early onset: Secondary | ICD-10-CM | POA: Diagnosis not present

## 2020-03-22 DIAGNOSIS — I2699 Other pulmonary embolism without acute cor pulmonale: Secondary | ICD-10-CM | POA: Diagnosis not present

## 2020-03-22 DIAGNOSIS — Z299 Encounter for prophylactic measures, unspecified: Secondary | ICD-10-CM | POA: Diagnosis not present

## 2020-03-22 DIAGNOSIS — M7550 Bursitis of unspecified shoulder: Secondary | ICD-10-CM | POA: Diagnosis not present

## 2020-05-27 DIAGNOSIS — Z23 Encounter for immunization: Secondary | ICD-10-CM | POA: Diagnosis not present

## 2020-06-27 DIAGNOSIS — Z0000 Encounter for general adult medical examination without abnormal findings: Secondary | ICD-10-CM | POA: Diagnosis not present

## 2020-06-27 DIAGNOSIS — R5383 Other fatigue: Secondary | ICD-10-CM | POA: Diagnosis not present

## 2020-06-27 DIAGNOSIS — Z299 Encounter for prophylactic measures, unspecified: Secondary | ICD-10-CM | POA: Diagnosis not present

## 2020-06-27 DIAGNOSIS — Z79899 Other long term (current) drug therapy: Secondary | ICD-10-CM | POA: Diagnosis not present

## 2020-06-27 DIAGNOSIS — Z7189 Other specified counseling: Secondary | ICD-10-CM | POA: Diagnosis not present

## 2020-09-22 DIAGNOSIS — M47812 Spondylosis without myelopathy or radiculopathy, cervical region: Secondary | ICD-10-CM | POA: Diagnosis not present

## 2020-09-22 DIAGNOSIS — M47892 Other spondylosis, cervical region: Secondary | ICD-10-CM | POA: Diagnosis not present

## 2020-09-22 DIAGNOSIS — J329 Chronic sinusitis, unspecified: Secondary | ICD-10-CM | POA: Diagnosis not present

## 2020-09-22 DIAGNOSIS — I440 Atrioventricular block, first degree: Secondary | ICD-10-CM | POA: Diagnosis not present

## 2020-09-22 DIAGNOSIS — R778 Other specified abnormalities of plasma proteins: Secondary | ICD-10-CM | POA: Diagnosis not present

## 2020-09-22 DIAGNOSIS — W19XXXA Unspecified fall, initial encounter: Secondary | ICD-10-CM | POA: Diagnosis not present

## 2020-09-22 DIAGNOSIS — J3489 Other specified disorders of nose and nasal sinuses: Secondary | ICD-10-CM | POA: Diagnosis not present

## 2020-09-22 DIAGNOSIS — Z743 Need for continuous supervision: Secondary | ICD-10-CM | POA: Diagnosis not present

## 2020-09-22 DIAGNOSIS — M47816 Spondylosis without myelopathy or radiculopathy, lumbar region: Secondary | ICD-10-CM | POA: Diagnosis not present

## 2020-09-22 DIAGNOSIS — N3000 Acute cystitis without hematuria: Secondary | ICD-10-CM | POA: Diagnosis not present

## 2020-09-22 DIAGNOSIS — N323 Diverticulum of bladder: Secondary | ICD-10-CM | POA: Diagnosis not present

## 2020-09-22 DIAGNOSIS — R748 Abnormal levels of other serum enzymes: Secondary | ICD-10-CM | POA: Diagnosis not present

## 2020-09-22 DIAGNOSIS — R4020 Unspecified coma: Secondary | ICD-10-CM | POA: Diagnosis not present

## 2020-09-22 DIAGNOSIS — R404 Transient alteration of awareness: Secondary | ICD-10-CM | POA: Diagnosis not present

## 2020-09-22 DIAGNOSIS — I499 Cardiac arrhythmia, unspecified: Secondary | ICD-10-CM | POA: Diagnosis not present

## 2020-09-22 DIAGNOSIS — Z20822 Contact with and (suspected) exposure to covid-19: Secondary | ICD-10-CM | POA: Diagnosis not present

## 2020-09-22 DIAGNOSIS — J323 Chronic sphenoidal sinusitis: Secondary | ICD-10-CM | POA: Diagnosis not present

## 2020-09-22 DIAGNOSIS — I700 Atherosclerosis of aorta: Secondary | ICD-10-CM | POA: Diagnosis not present

## 2020-09-23 ENCOUNTER — Inpatient Hospital Stay (HOSPITAL_COMMUNITY)
Admission: AD | Admit: 2020-09-23 | Discharge: 2020-09-26 | DRG: 565 | Disposition: A | Discharge status: Home Health | Payer: Medicare Other | Source: Other Acute Inpatient Hospital | Attending: Internal Medicine | Admitting: Internal Medicine

## 2020-09-23 ENCOUNTER — Encounter (HOSPITAL_COMMUNITY): Payer: Self-pay | Admitting: Family Medicine

## 2020-09-23 DIAGNOSIS — R52 Pain, unspecified: Secondary | ICD-10-CM

## 2020-09-23 DIAGNOSIS — S22061A Stable burst fracture of T7-T8 vertebra, initial encounter for closed fracture: Secondary | ICD-10-CM | POA: Diagnosis present

## 2020-09-23 DIAGNOSIS — R6510 Systemic inflammatory response syndrome (SIRS) of non-infectious origin without acute organ dysfunction: Secondary | ICD-10-CM | POA: Diagnosis not present

## 2020-09-23 DIAGNOSIS — J329 Chronic sinusitis, unspecified: Secondary | ICD-10-CM | POA: Diagnosis not present

## 2020-09-23 DIAGNOSIS — M4324 Fusion of spine, thoracic region: Secondary | ICD-10-CM | POA: Diagnosis not present

## 2020-09-23 DIAGNOSIS — N400 Enlarged prostate without lower urinary tract symptoms: Secondary | ICD-10-CM | POA: Diagnosis present

## 2020-09-23 DIAGNOSIS — G3184 Mild cognitive impairment, so stated: Secondary | ICD-10-CM | POA: Diagnosis present

## 2020-09-23 DIAGNOSIS — M2578 Osteophyte, vertebrae: Secondary | ICD-10-CM | POA: Diagnosis not present

## 2020-09-23 DIAGNOSIS — F0390 Unspecified dementia without behavioral disturbance: Secondary | ICD-10-CM | POA: Diagnosis present

## 2020-09-23 DIAGNOSIS — R7989 Other specified abnormal findings of blood chemistry: Secondary | ICD-10-CM | POA: Diagnosis present

## 2020-09-23 DIAGNOSIS — Z86718 Personal history of other venous thrombosis and embolism: Secondary | ICD-10-CM

## 2020-09-23 DIAGNOSIS — G9341 Metabolic encephalopathy: Secondary | ICD-10-CM | POA: Diagnosis present

## 2020-09-23 DIAGNOSIS — M47892 Other spondylosis, cervical region: Secondary | ICD-10-CM | POA: Diagnosis not present

## 2020-09-23 DIAGNOSIS — I313 Pericardial effusion (noninflammatory): Secondary | ICD-10-CM | POA: Diagnosis not present

## 2020-09-23 DIAGNOSIS — I248 Other forms of acute ischemic heart disease: Secondary | ICD-10-CM | POA: Diagnosis not present

## 2020-09-23 DIAGNOSIS — N3000 Acute cystitis without hematuria: Secondary | ICD-10-CM | POA: Diagnosis not present

## 2020-09-23 DIAGNOSIS — M40204 Unspecified kyphosis, thoracic region: Secondary | ICD-10-CM | POA: Diagnosis not present

## 2020-09-23 DIAGNOSIS — R55 Syncope and collapse: Secondary | ICD-10-CM | POA: Diagnosis not present

## 2020-09-23 DIAGNOSIS — Z981 Arthrodesis status: Secondary | ICD-10-CM | POA: Diagnosis not present

## 2020-09-23 DIAGNOSIS — R778 Other specified abnormalities of plasma proteins: Secondary | ICD-10-CM | POA: Diagnosis present

## 2020-09-23 DIAGNOSIS — M6282 Rhabdomyolysis: Secondary | ICD-10-CM | POA: Diagnosis not present

## 2020-09-23 DIAGNOSIS — I440 Atrioventricular block, first degree: Secondary | ICD-10-CM | POA: Diagnosis not present

## 2020-09-23 DIAGNOSIS — Z86711 Personal history of pulmonary embolism: Secondary | ICD-10-CM

## 2020-09-23 DIAGNOSIS — M542 Cervicalgia: Secondary | ICD-10-CM | POA: Diagnosis not present

## 2020-09-23 DIAGNOSIS — W010XXA Fall on same level from slipping, tripping and stumbling without subsequent striking against object, initial encounter: Secondary | ICD-10-CM | POA: Diagnosis present

## 2020-09-23 DIAGNOSIS — Y92007 Garden or yard of unspecified non-institutional (private) residence as the place of occurrence of the external cause: Secondary | ICD-10-CM | POA: Diagnosis not present

## 2020-09-23 DIAGNOSIS — T796XXA Traumatic ischemia of muscle, initial encounter: Secondary | ICD-10-CM | POA: Diagnosis not present

## 2020-09-23 DIAGNOSIS — Z20822 Contact with and (suspected) exposure to covid-19: Secondary | ICD-10-CM | POA: Diagnosis present

## 2020-09-23 DIAGNOSIS — S22069A Unspecified fracture of T7-T8 vertebra, initial encounter for closed fracture: Secondary | ICD-10-CM | POA: Diagnosis not present

## 2020-09-23 DIAGNOSIS — I700 Atherosclerosis of aorta: Secondary | ICD-10-CM | POA: Diagnosis not present

## 2020-09-23 DIAGNOSIS — R748 Abnormal levels of other serum enzymes: Secondary | ICD-10-CM | POA: Diagnosis not present

## 2020-09-23 HISTORY — DX: Other displaced fracture of first cervical vertebra, initial encounter for closed fracture: S12.090A

## 2020-09-23 HISTORY — DX: Mild cognitive impairment of uncertain or unknown etiology: G31.84

## 2020-09-23 HISTORY — DX: Other pulmonary embolism without acute cor pulmonale: I26.99

## 2020-09-23 HISTORY — DX: Benign prostatic hyperplasia without lower urinary tract symptoms: N40.0

## 2020-09-23 HISTORY — DX: Other disorders of bilirubin metabolism: E80.6

## 2020-09-23 LAB — COMPREHENSIVE METABOLIC PANEL
ALT: 49 U/L — ABNORMAL HIGH (ref 0–44)
AST: 187 U/L — ABNORMAL HIGH (ref 15–41)
Albumin: 3.2 g/dL — ABNORMAL LOW (ref 3.5–5.0)
Alkaline Phosphatase: 47 U/L (ref 38–126)
Anion gap: 8 (ref 5–15)
BUN: 13 mg/dL (ref 8–23)
CO2: 22 mmol/L (ref 22–32)
Calcium: 8.6 mg/dL — ABNORMAL LOW (ref 8.9–10.3)
Chloride: 104 mmol/L (ref 98–111)
Creatinine, Ser: 0.9 mg/dL (ref 0.61–1.24)
GFR, Estimated: >60 mL/min (ref >60)
Glucose, Bld: 117 mg/dL — ABNORMAL HIGH (ref 70–99)
Potassium: 4 mmol/L (ref 3.5–5.1)
Sodium: 134 mmol/L — ABNORMAL LOW (ref 135–145)
Total Bilirubin: 2 mg/dL — ABNORMAL HIGH (ref 0.3–1.2)
Total Protein: 6.3 g/dL — ABNORMAL LOW (ref 6.5–8.1)

## 2020-09-23 LAB — TROPONIN I (HIGH SENSITIVITY): Troponin I (High Sensitivity): 68 ng/L — ABNORMAL HIGH (ref <18)

## 2020-09-23 LAB — CBC
HCT: 39.2 % (ref 39.0–52.0)
Hemoglobin: 13.2 g/dL (ref 13.0–17.0)
MCH: 32.8 pg (ref 26.0–34.0)
MCHC: 33.7 g/dL (ref 30.0–36.0)
MCV: 97.5 fL (ref 80.0–100.0)
Platelets: 161 10*3/uL (ref 150–400)
RBC: 4.02 MIL/uL — ABNORMAL LOW (ref 4.22–5.81)
RDW: 12.2 % (ref 11.5–15.5)
WBC: 11.3 10*3/uL — ABNORMAL HIGH (ref 4.0–10.5)
nRBC: 0 % (ref 0.0–0.2)

## 2020-09-23 LAB — D-DIMER, QUANTITATIVE: D-Dimer, Quant: 10.4 ug/mL-FEU — ABNORMAL HIGH (ref 0.00–0.50)

## 2020-09-23 MED ORDER — ONDANSETRON HCL 4 MG PO TABS
4.0000 mg | ORAL_TABLET | Freq: Four times a day (QID) | ORAL | Status: DC | PRN
Start: 2020-09-23 — End: 2020-09-27

## 2020-09-23 MED ORDER — LACTATED RINGERS IV SOLN
INTRAVENOUS | Status: DC
  Administered 2020-09-23: 125 mL/h via INTRAVENOUS

## 2020-09-23 MED ORDER — ACETAMINOPHEN 650 MG RE SUPP: 650.0000 mg | Freq: Four times a day (QID) | RECTAL | Status: DC | PRN

## 2020-09-23 MED ORDER — ENOXAPARIN SODIUM 40 MG/0.4ML IJ SOSY
40.0000 mg | PREFILLED_SYRINGE | INTRAMUSCULAR | Status: DC
  Administered 2020-09-23: 40 mg via SUBCUTANEOUS
  Filled 2020-09-23: qty 0.4

## 2020-09-23 MED ORDER — ACETAMINOPHEN 325 MG PO TABS: 650.0000 mg | ORAL_TABLET | Freq: Four times a day (QID) | ORAL | Status: DC | PRN

## 2020-09-23 MED ORDER — ONDANSETRON HCL 4 MG/2ML IJ SOLN: 4.0000 mg | Freq: Four times a day (QID) | INTRAMUSCULAR | Status: DC | PRN

## 2020-09-23 NOTE — H&P (Signed)
History and Physical    Randy Mason TOI:[id number] DOB: 1932/06/04 DOA: 09/23/2020  PCP: [name], MD  Patient coming from: Home, OSH  I have personally briefly reviewed patient's old medical records in [facility]  Chief Complaint: Found down in ditch  HPI: Randy Mason is a 87 y.o. male with medical history significant of MCI, BPH, ITP in 2012, mild hyperbilirubinemia, C1 compression fx in 2020, incidental finding of PE at same time he was admitted for the fall and C1 compression fx in 2020 (at time of admission).  No longer on anticoagulation.  Pt and wife have had respiratory illness recently.  On 5/1 pt felt he was stuck in house all day and decided to go outside for a walk.  He reports due to uneven grass his foot got caught, he tripped, fell, and was unable to get up.  After a couple of hours, wife called EMS.  Pt was found down outside in storm and brought to ED at [facility].  Pt denied LOC.  ED Course: Pt found to have trop elevation which seems to have peaked at 249 before trending back down some.  WBC 15k. Tbili 2.5 (2.0 in 2020). Creat 1.2  UA with many bacteria, only 4 WBC, no LE nor nitrites, Mod-large HGB but only a couple RBC.  EDP thought pt had UTI and gave rocephin.  CPK came back at 3k initially, 10.9k on repeat this morning.  CT head neg. CT AP: nothing acute (has really enlarged prostate). CXR: neg  At [facility]:  Currently had T 100.6 after arriving on unit here at [facility].  Denies CP, SOB, fever, chills, leg pain or swelling.  Only complaint at this time is that he has to urinate.  Review of Systems: As per HPI, otherwise all review of systems negative.  Past Medical History:  Diagnosis Date  . BPH (benign prostatic hyperplasia)   . Compression fracture of C1 vertebra (HCC)   . History of ITP 2012  . Hyperbilirubinemia   . MCI (mild cognitive impairment)   . Pulmonary embolism [facility])     Past Surgical History:  Procedure  Laterality Date  . URETHRA SURGERY       reports that he has never smoked. He does not have any smokeless tobacco history on file. He reports current alcohol use of about 1.0 - 4.0 standard drink of alcohol per week. He reports that he does not use drugs.  No Known Allergies  Family History  Problem Relation Age of Onset  . Deep vein thrombosis Father   . Anesthesia problems Neg Hx   . Clotting disorder Neg Hx      Prior to Admission medications   Not on File    Physical Exam: Vitals:   09/23/20 2149 09/23/20 2200  BP: (!) 159/78   Pulse: 97   Resp: 20   Temp: (!) 100.6 F (38.1 C)   TempSrc: Oral   SpO2: 97%   Weight:  105.1 kg  Height:  6\' 1"  (1.854 m)    Constitutional: NAD, calm, comfortable Eyes: PERRL, lids and conjunctivae normal ENMT: Mucous membranes are moist. Posterior pharynx clear of any exudate or lesions.Normal dentition.  Neck: normal, supple, no masses, no thyromegaly Respiratory: clear to auscultation bilaterally, no wheezing, no crackles. Normal respiratory effort. No accessory muscle use.  Cardiovascular: Regular rate and rhythm, no murmurs / rubs / gallops. No extremity edema. 2+ pedal pulses. No carotid bruits.  Abdomen: no tenderness, no masses palpated. No hepatosplenomegaly.  Bowel sounds positive.  Musculoskeletal: no clubbing / cyanosis. No joint deformity upper and lower extremities. Good ROM, no contractures. Normal muscle tone.  Skin: no rashes, lesions, ulcers. No induration Neurologic: CN 2-12 grossly intact. Sensation intact, DTR normal. Strength 5/5 in all 4.  Psychiatric: Normal judgment and insight. Alert and oriented x 3. Normal mood.    Labs on Admission: I have personally reviewed following labs and imaging studies  CBC: No results for input(s): WBC, NEUTROABS, HGB, HCT, MCV, PLT in the last 168 hours. Basic Metabolic Panel: No results for input(s): NA, K, CL, CO2, GLUCOSE, BUN, CREATININE, CALCIUM, MG, PHOS in the last 168  hours. GFR: CrCl cannot be calculated (Patient's most recent lab result is older than the maximum 21 days allowed.). Liver Function Tests: No results for input(s): AST, ALT, ALKPHOS, BILITOT, PROT, ALBUMIN in the last 168 hours. No results for input(s): LIPASE, AMYLASE in the last 168 hours. No results for input(s): AMMONIA in the last 168 hours. Coagulation Profile: No results for input(s): INR, PROTIME in the last 168 hours. Cardiac Enzymes: No results for input(s): CKTOTAL, CKMB, CKMBINDEX, TROPONINI in the last 168 hours. BNP (last 3 results) No results for input(s): PROBNP in the last 8760 hours. HbA1C: No results for input(s): HGBA1C in the last 72 hours. CBG: No results for input(s): GLUCAP in the last 168 hours. Lipid Profile: No results for input(s): CHOL, HDL, LDLCALC, TRIG, CHOLHDL, LDLDIRECT in the last 72 hours. Thyroid Function Tests: No results for input(s): TSH, T4TOTAL, FREET4, T3FREE, THYROIDAB in the last 72 hours. Anemia Panel: No results for input(s): VITAMINB12, FOLATE, FERRITIN, TIBC, IRON, RETICCTPCT in the last 72 hours. Urine analysis:    Component Value Date/Time   COLORURINE RED (A) 01/11/2011 0426   APPEARANCEUR CLOUDY (A) 01/11/2011 0426   LABSPEC 1.014 01/11/2011 0426   PHURINE 5.0 01/11/2011 0426   GLUCOSEU 100 (A) 01/11/2011 0426   HGBUR LARGE (A) 01/11/2011 0426   BILIRUBINUR LARGE (A) 01/11/2011 0426   KETONESUR 40 (A) 01/11/2011 0426   PROTEINUR >300 (A) 01/11/2011 0426   UROBILINOGEN 1.0 01/11/2011 0426   NITRITE POSITIVE (A) 01/11/2011 0426   LEUKOCYTESUR LARGE (A) 01/11/2011 0426    Radiological Exams on Admission: No results found.  EKG: Independently reviewed.  1st degree AV block, Sinus rhythm rate of 94.  Assessment/Plan Principal Problem:   Rhabdomyolysis Active Problems:   Elevated troponin   SIRS (systemic inflammatory response syndrome) (HCC)   MCI (mild cognitive impairment) with memory loss    1. Rhabdomyolysis  - 1. CPK of 3k then 10.9k at OSH 2. IVF: starting LR at 150 cc/hr here 3. Strict intake and output 4. Repeat CPK pending now and again in AM 5. Checking repeat CMP to look at renal function. 2. Elevated trop - 1. Getting 2d echo in AM 2. CP free 3. Pt denied syncope. 4. EKG unremarkable 5. Trop seemed to be trending back down at OSH 6. Checking repeat trop here 7. Holding off on heparin gtt for the moment 8. Tele monitor 3. H/O PE - 1. In 2020 at time of admission for C1 fx, incedental 2. No longer on anticoags 3. But not clear at all that the C1 fx is what provoked the PE. 4. ? Recurrent PE today as cause for trop elevation? 5. Check d.dimer 6. Checking CMP to look at renal function 7. dont want to get CTA chest in pt with acute rhabdo until rhabdo starts improving and renal fxn normal... 8. Dont really  have high enough suspicion at this point to just start heparin gtt without some more reason. 4. SIRS - 1. Unclear source 2. Recent resp illness? 3. Getting BCx, UCx 4. EDP thought pt had UTI but im less impressed by UA at OSH 5. Check procalcitonin 6. Going to hold off on ABx for the moment. 5. MCI - 1. Apparently chronic, possibly progressed to dementia at this point. 2. Unclear what baseline is.  DVT prophylaxis: Lovenox Code Status: Full Family Communication: No family in room Disposition Plan: Home after workup of trop, treatment of rhabdo. Consults called: None Admission status: Admit to inpatient  Severity of Illness: The appropriate patient status for this patient is INPATIENT. Inpatient status is judged to be reasonable and necessary in order to provide the required intensity of service to ensure the patient's safety. The patient's presenting symptoms, physical exam findings, and initial radiographic and laboratory data in the context of their chronic comorbidities is felt to place them at high risk for further clinical deterioration. Furthermore, it is not  anticipated that the patient will be medically stable for discharge from the hospital within 2 midnights of admission. The following factors support the patient status of inpatient.   Treatment of rhabdomyolysis with high volume IVF.  Also need to work up the trop elevation as well as the SIRS.   * I certify that at the point of admission it is my clinical judgment that the patient will require inpatient hospital care spanning beyond 2 midnights from the point of admission due to high intensity of service, high risk for further deterioration and high frequency of surveillance required.*    Raye Wiens M. DO [facility]  How to contact the [facility] Attending or Consulting provider 7A - 7P or covering provider during after hours 7P -7A, for this patient?  1. Check the care team in [facility] and look for a) attending/consulting TR[facility] provider listed and b) the [facility] team listed 2. Log into [email or web address]  Amion Physician Scheduling and messaging for groups and whole hospitals  On call and physician scheduling software for group practices, residents, hospitalists and other medical providers for call, clinic, rotation and shift schedules. OnCall Enterprise is a hospital-wide system for scheduling doctors and paging doctors on call. EasyPlot is for scientific plotting and data analysis.  [email or web address]  and use Joseph's universal password to access. If you do not have the password, please contact the hospital operator.  3. Locate the [facility] provider you are looking for under [facility] and page to a number that you can be directly reached. 4. If you still have difficulty reaching the provider, please page the [facility] (Director on Call) for the Hospitalists listed on amion for assistance.  09/23/2020, 10:45 PM

## 2020-09-23 NOTE — Plan of Care (Signed)

## 2020-09-23 NOTE — Progress Notes (Addendum)
87 yo m with h/o DVT and PE after a C- spine fracture, not on any anticoag. anymore, and likely dementia coming with syncope while walking  outside his home and apparently falling  in a ditch where he was for several  hours. Has Rhabdo with CK 3000 and Trop 134 and later 186. Cr 1.26 and WBCs 15.5. Head and C- spine and abd and pelvic CT negative. Has UTI. Given Rocephin  and IVF bolus so far. Accepted to Cardiac Tele bed at [facility] hospital from [facility].

## 2020-09-24 ENCOUNTER — Inpatient Hospital Stay (HOSPITAL_COMMUNITY): Payer: Medicare Other

## 2020-09-24 DIAGNOSIS — R55 Syncope and collapse: Secondary | ICD-10-CM

## 2020-09-24 DIAGNOSIS — R7989 Other specified abnormal findings of blood chemistry: Secondary | ICD-10-CM | POA: Diagnosis not present

## 2020-09-24 LAB — ECHOCARDIOGRAM COMPLETE
AR max vel: 4.48 cm2
AV Area VTI: 4.85 cm2
AV Area mean vel: 4.66 cm2
AV Mean grad: 3 mmHg
AV Peak grad: 5.9 mmHg
Ao pk vel: 1.21 m/s
Area-P 1/2: 5.54 cm2
Calc EF: 58.9 %
Height: 73 in
S' Lateral: 1.8 cm
Single Plane A2C EF: 41.7 %
Single Plane A4C EF: 72.9 %
Weight: 3700.2 oz

## 2020-09-24 LAB — BASIC METABOLIC PANEL
Anion gap: 7 (ref 5–15)
BUN: 12 mg/dL (ref 8–23)
CO2: 23 mmol/L (ref 22–32)
Calcium: 8.6 mg/dL — ABNORMAL LOW (ref 8.9–10.3)
Chloride: 104 mmol/L (ref 98–111)
Creatinine, Ser: 0.94 mg/dL (ref 0.61–1.24)
GFR, Estimated: >60 mL/min (ref >60)
Glucose, Bld: 121 mg/dL — ABNORMAL HIGH (ref 70–99)
Potassium: 4 mmol/L (ref 3.5–5.1)
Sodium: 134 mmol/L — ABNORMAL LOW (ref 135–145)

## 2020-09-24 LAB — URINALYSIS, ROUTINE W REFLEX MICROSCOPIC
Bacteria, UA: NONE SEEN
Bilirubin Urine: NEGATIVE
Glucose, UA: NEGATIVE mg/dL
Ketones, ur: 5 mg/dL — AB
Leukocytes,Ua: NEGATIVE
Nitrite: NEGATIVE
Protein, ur: NEGATIVE mg/dL
Specific Gravity, Urine: 1.019 (ref 1.005–1.030)
pH: 5 (ref 5.0–8.0)

## 2020-09-24 LAB — CBC
HCT: 37.9 % — ABNORMAL LOW (ref 39.0–52.0)
Hemoglobin: 12.9 g/dL — ABNORMAL LOW (ref 13.0–17.0)
MCH: 32.8 pg (ref 26.0–34.0)
MCHC: 34 g/dL (ref 30.0–36.0)
MCV: 96.4 fL (ref 80.0–100.0)
Platelets: 146 10*3/uL — ABNORMAL LOW (ref 150–400)
RBC: 3.93 MIL/uL — ABNORMAL LOW (ref 4.22–5.81)
RDW: 12.2 % (ref 11.5–15.5)
WBC: 10.2 10*3/uL (ref 4.0–10.5)
nRBC: 0 % (ref 0.0–0.2)

## 2020-09-24 LAB — TROPONIN I (HIGH SENSITIVITY): Troponin I (High Sensitivity): 69 ng/L — ABNORMAL HIGH (ref <18)

## 2020-09-24 LAB — SARS CORONAVIRUS 2 (TAT 6-24 HRS): SARS Coronavirus 2: NEGATIVE

## 2020-09-24 LAB — CK
Total CK: 6882 U/L — ABNORMAL HIGH (ref 49–397)
Total CK: 6960 U/L — ABNORMAL HIGH (ref 49–397)

## 2020-09-24 LAB — PROCALCITONIN: Procalcitonin: 0.43 ng/mL

## 2020-09-24 MED ORDER — HEPARIN BOLUS VIA INFUSION
5000.0000 [IU] | Freq: Once | INTRAVENOUS | Status: AC
  Administered 2020-09-24: 5000 [IU] via INTRAVENOUS
  Filled 2020-09-24: qty 5000

## 2020-09-24 MED ORDER — HEPARIN (PORCINE) 25000 UT/250ML-% IV SOLN
1900.0000 [IU]/h | INTRAVENOUS | Status: DC
  Administered 2020-09-24: 1600 [IU]/h via INTRAVENOUS
  Filled 2020-09-24 (×2): qty 250

## 2020-09-24 MED ORDER — IOHEXOL 350 MG/ML SOLN
75.0000 mL | Freq: Once | INTRAVENOUS | Status: AC | PRN
  Administered 2020-09-24: 75 mL via INTRAVENOUS

## 2020-09-24 NOTE — Progress Notes (Signed)
ANTICOAGULATION CONSULT NOTE - Initial Consult  Pharmacy Consult for Heparin Indication: pulmonary embolus  No Known Allergies  Patient Measurements: Height: 6\' 1"  (185.4 cm) Weight: 104.9 kg (231 lb 4.2 oz) IBW/kg (Calculated) : 79.9 Heparin Dosing Weight: 101.4 kg  Vital Signs: Temp: 99.4 F (37.4 C) (05/03 1924) Temp Source: Oral (05/03 1924) BP: 150/78 (05/03 1924) Pulse Rate: 91 (05/03 1924)  Labs: Recent Labs    09/23/20 2227 09/24/20 0031  HGB 13.2 12.9*  HCT 39.2 37.9*  PLT 161 146*  CREATININE 0.90 0.94  CKTOTAL 6,882* 6,960*  TROPONINIHS 68* 69*    Estimated Creatinine Clearance: 71.7 mL/min (by C-G formula based on SCr of 0.94 mg/dL).   Medical History: Past Medical History:  Diagnosis Date  . BPH (benign prostatic hyperplasia)   . Compression fracture of C1 vertebra (HCC)   . History of ITP 2012  . Hyperbilirubinemia   . MCI (mild cognitive impairment)   . Pulmonary embolism (HCC)     Assessment: 87 y.o.malewith medical history significant ofMCI, BPH, ITP in 2012, mild hyperbilirubinemia, C1 compression fx and PE in 2020; admitted for a fall.  Pharmacy is consulted to start heparin possible PE. Patient was not on any anticoagulants PTA.  Goal of Therapy:  Heparin level 0.3-0.7 units/ml Monitor platelets by anticoagulation protocol: Yes   Plan:  Give 5000 units bolus x 1 Start heparin infusion at 1600 units/hr Check anti-Xa level in 6 hours and daily while on heparin Continue to monitor H&H and platelets  2021, PharmD, [facility] Clinical Pharmacist Please see AMION for all Pharmacists' Contact Phone Numbers 09/24/2020, 10:45 PM

## 2020-09-24 NOTE — Progress Notes (Signed)
PROGRESS NOTE    Randy Mason  YHC:[id number] DOB: 1932/09/17 DOA: 09/23/2020 PCP: [name], MD   Brief Narrative:  HPI on 09/23/2020 by Dr. Lyda Perone Randy Mason is a 87 y.o. male with medical history significant of MCI, BPH, ITP in 2012, mild hyperbilirubinemia, C1 compression fx in 2020, incidental finding of PE at same time he was admitted for the fall and C1 compression fx in 2020 (at time of admission).  No longer on anticoagulation.  Pt and wife have had respiratory illness recently.  On 5/1 pt felt he was stuck in house all day and decided to go outside for a walk.  He reports due to uneven grass his foot got caught, he tripped, fell, and was unable to get up.  After a couple of hours, wife called EMS.  Pt was found down outside in storm and brought to ED at [facility].  Pt denied LOC.  Interim history Admitted with acute rhabdomyolysis and on IVF.  Assessment & Plan   Acute rhabdomyolysis  -Likely secondary to fall and being found down for 3-4 hours per wife -CPK level up to 6960 today -Continue IVF and monitor   Elevated Ddimer with history of PE  -possibly due the above -will obtain CTA chest to rule out PE and Lower extremity doppler  Elevated troponin -likely secondary to the above -High sensitivity troponin 68 and 69  -pending echocardiogram -no complaints of chest pain  SIRS -patient with fever upon admission -COVID negative  -Respiratory viral panel at outside hospital was unremarkable -CXR at outside hospital showed no active disease -UA unremarkable for infection; however moderate hemoglobin -hold off on starting antibiotics and monitor   Fall -Patient had gone for a walk and fell and was unable to get up.  Patient states that he tripped -CT head at outside facility showed no acute intercranial process. -CT cervical spine without acute fracture -PT recommended home health -Will continue to monitor  Mild cognitive  impairment -Per discussion with wife, patient has been diagnosed with dementia and was placed on denies a pill however patient did not take this medication given side effects -Currently he is alert and oriented x2 -Will continue to monitor closely  DVT Prophylaxis Lovenox  Code Status: Full  Family Communication: None at bedside.  Wife via phone  Disposition Plan:  Status is: Inpatient  Remains inpatient appropriate because:IV treatments appropriate due to intensity of illness or inability to take PO   Dispo: The patient is from: Home              Anticipated d/c is to: Home              Patient currently is not medically stable to d/c.   Difficult to place patient No   Consultants None  Procedures  None  Antibiotics   Anti-infectives (From admission, onward)   None      Subjective:   Randy Mason seen and examined today.  Patient has no complaints this morning other than wanting to go to the bathroom.  Feels he needs to get up.  Denies chest pain, shortness of breath, abdominal pain, nausea or vomiting, dizziness or headache.    Objective:   Vitals:   09/24/20 0107 09/24/20 0400 09/24/20 0808 09/24/20 1204  BP: (!) 151/79 (!) 149/90 (!) 147/61 (!) 140/56  Pulse: 94 91 98 88  Resp: 20  20 16   Temp: 98.8 F (37.1 C) 98.7 F (37.1 C) 98.8 F (37.1 C) 99.1 F (  37.3 C)  TempSrc: Oral Oral Oral Oral  SpO2: 96% 93% 96% 97%  Weight: 104.9 kg     Height:        Intake/Output Summary (Last 24 hours) at 09/24/2020 1308 Last data filed at 09/24/2020 1024 Gross per 24 hour  Intake 1308.41 ml  Output 1000 ml  Net 308.41 ml   Filed Weights   09/23/20 2200 09/24/20 0107  Weight: 105.1 kg 104.9 kg    Exam  General: Well developed, well nourished, NAD, appears stated age  HEENT: NCAT, mucous membranes moist.   Cardiovascular: S1 S2 auscultated, RRR  Respiratory: Diminished breath sounds, no wheezing  Abdomen: Soft, nontender, nondistended, + bowel  sounds  Extremities: warm dry without cyanosis clubbing or edema  Neuro: AAOx2, hard of hearing otherwise nonfocal  Psych: mildly confused, however appropriate mood and affect   Data Reviewed: I have personally reviewed following labs and imaging studies  CBC: Recent Labs  Lab 09/23/20 2227 09/24/20 0031  WBC 11.3* 10.2  HGB 13.2 12.9*  HCT 39.2 37.9*  MCV 97.5 96.4  PLT 161 146*   Basic Metabolic Panel: Recent Labs  Lab 09/23/20 2227 09/24/20 0031  NA 134* 134*  K 4.0 4.0  CL 104 104  CO2 22 23  GLUCOSE 117* 121*  BUN 13 12  CREATININE 0.90 0.94  CALCIUM 8.6* 8.6*   GFR: Estimated Creatinine Clearance: 71.7 mL/min (by C-G formula based on SCr of 0.94 mg/dL). Liver Function Tests: Recent Labs  Lab 09/23/20 2227  AST 187*  ALT 49*  ALKPHOS 47  BILITOT 2.0*  PROT 6.3*  ALBUMIN 3.2*   No results for input(s): LIPASE, AMYLASE in the last 168 hours. No results for input(s): AMMONIA in the last 168 hours. Coagulation Profile: No results for input(s): INR, PROTIME in the last 168 hours. Cardiac Enzymes: Recent Labs  Lab 09/23/20 2227 09/24/20 0031  CKTOTAL 6,882* 6,960*   BNP (last 3 results) No results for input(s): PROBNP in the last 8760 hours. HbA1C: No results for input(s): HGBA1C in the last 72 hours. CBG: No results for input(s): GLUCAP in the last 168 hours. Lipid Profile: No results for input(s): CHOL, HDL, LDLCALC, TRIG, CHOLHDL, LDLDIRECT in the last 72 hours. Thyroid Function Tests: No results for input(s): TSH, T4TOTAL, FREET4, T3FREE, THYROIDAB in the last 72 hours. Anemia Panel: No results for input(s): VITAMINB12, FOLATE, FERRITIN, TIBC, IRON, RETICCTPCT in the last 72 hours. Urine analysis:    Component Value Date/Time   COLORURINE YELLOW 09/23/2020 2341   APPEARANCEUR HAZY (A) 09/23/2020 2341   LABSPEC 1.019 09/23/2020 2341   PHURINE 5.0 09/23/2020 2341   GLUCOSEU NEGATIVE 09/23/2020 2341   HGBUR MODERATE (A) 09/23/2020 2341    BILIRUBINUR NEGATIVE 09/23/2020 2341   KETONESUR 5 (A) 09/23/2020 2341   PROTEINUR NEGATIVE 09/23/2020 2341   UROBILINOGEN 1.0 01/11/2011 0426   NITRITE NEGATIVE 09/23/2020 2341   LEUKOCYTESUR NEGATIVE 09/23/2020 2341   Sepsis Labs: @LABRCNTIP (procalcitonin:4,lacticidven:4)  ) Recent Results (from the past 240 hour(s))  SARS CORONAVIRUS 2 (TAT 6-24 HRS) Nasopharyngeal Nasopharyngeal Swab     Status: None   Collection Time: 09/23/20 10:00 PM   Specimen: Nasopharyngeal Swab  Result Value Ref Range Status   SARS Coronavirus 2 NEGATIVE NEGATIVE Final    Comment: (NOTE) SARS-CoV-2 target nucleic acids are NOT DETECTED.  The SARS-CoV-2 RNA is generally detectable in upper and lower respiratory specimens during the acute phase of infection. Negative results do not preclude SARS-CoV-2 infection, do not rule out co-infections with  other pathogens, and should not be used as the sole basis for treatment or other patient management decisions. Negative results must be combined with clinical observations, patient history, and epidemiological information. The expected result is Negative.  Fact Sheet for Patients: HairSlick.no  Fact Sheet for Healthcare Providers: quierodirigir.com  This test is not yet approved or cleared by the [place] FDA and  has been authorized for detection and/or diagnosis of SARS-CoV-2 by FDA under an Emergency Use Authorization (EUA). This EUA will remain  in effect (meaning this test can be used) for the duration of the COVID-19 declaration under Se ction 564(b)(1) of the Act, 21 U.S.C. section 360bbb-3(b)(1), unless the authorization is terminated or revoked sooner.  Performed at [facility], [address] [place], [place] [address]   Culture, blood (routine x 2)     Status: None (Preliminary result)   Collection Time: 09/23/20 10:27 PM   Specimen: BLOOD RIGHT HAND  Result Value Ref  Range Status   Specimen Description BLOOD RIGHT HAND  Final   Special Requests   Final    BOTTLES DRAWN AEROBIC ONLY Blood Culture results may not be optimal due to an inadequate volume of blood received in culture bottles   Culture   Final    NO GROWTH < 24 HOURS Performed at [facility], [address] [place], [place] [address]    Report Status PENDING  Incomplete  Culture, blood (routine x 2)     Status: None (Preliminary result)   Collection Time: 09/23/20 10:27 PM   Specimen: BLOOD  Result Value Ref Range Status   Specimen Description BLOOD RIGHT ARM  Final   Special Requests   Final    BOTTLES DRAWN AEROBIC ONLY Blood Culture results may not be optimal due to an inadequate volume of blood received in culture bottles   Culture   Final    NO GROWTH < 24 HOURS Performed at [facility], [address] [place], [place] [address]    Report Status PENDING  Incomplete      Radiology Studies: No results found.   Scheduled Meds: . enoxaparin (LOVENOX) injection  40 mg Subcutaneous Q24H   Continuous Infusions: . lactated ringers 150 mL/hr at 09/24/20 0400     LOS: 1 day   Time Spent in minutes   45 minutes  Freddye Cardamone D.O. on 09/24/2020 at 1:08 PM  Between 7am to 7pm - Please see pager noted on [email or web address]  After 7pm go to [email or web address]  And look for the night coverage person covering for me after hours  [facility] Office  [phone]

## 2020-09-24 NOTE — Progress Notes (Signed)
Lower extremity venous has been completed.   Preliminary results in CV Proc.   Blanch Media 09/24/2020 2:32 PM

## 2020-09-24 NOTE — Evaluation (Signed)
Physical Therapy Evaluation Patient Details Name: Randy Mason MRN: [id number] DOB: Apr 29, 1933 Today's Date: 09/24/2020   History of Present Illness  Pt is an 87 y.o. male admitted 09/23/20 after being found down outside; pt reports tripping on uneven grass and unable to get up, pt denies LOC. Workup for rhabdomyolysis, SIRS. Head CT negative. CXR negative. PMH includes fall with C1 compression fx and PE (2020), dementia.    Clinical Impression  Pt presents with an overall decrease in functional mobility secondary to above. PTA, pt ambulatory without DME, still driving daily, indep with ADL tasks; lives with wife who ambulates with [employer] [place], wife reports other family available to assist PRN. Today, pt's stability improved ambulating with RW and min guard to minA for safety. Pt A&Ox2 with decreased safety awareness. Pt would benefit from continued acute PT services to maximize functional mobility and independence prior to d/c with HHPT services.     Follow Up Recommendations Home health PT;Supervision/Assistance - 24 hour    Equipment Recommendations  None recommended by PT    Recommendations for Other Services       Precautions / Restrictions Precautions Precautions: Fall Restrictions Weight Bearing Restrictions: No      Mobility  Bed Mobility Overal bed mobility: Needs Assistance Bed Mobility: Supine to Sit     Supine to sit: Min assist;HOB elevated     General bed mobility comments: MinA for HHA to elevate trunk    Transfers Overall transfer level: Needs assistance Equipment used: None;Rolling walker (2 wheeled) Transfers: Sit to/from Stand Sit to Stand: Min guard;Min assist         General transfer comment: Initial min guard for balance standing from EOB without DME; additional stand from recliner to RW, light minA for trunk elevation and stability  Ambulation/Gait Ambulation/Gait assistance: Min guard Gait Distance (Feet): 42 Feet Assistive device: None;Rolling  walker (2 wheeled) Gait Pattern/deviations: Step-through pattern;Decreased stride length;Trunk flexed     General Gait Details: Initial steps from bed to recliner, pt maintaining flexed posture and falling into chair; additional gait trial with RW, pt walking very fast requiring cues to for slowed gait speed and safety, min guard for balance  Stairs            Wheelchair Mobility    Modified Rankin (Stroke Patients Only)       Balance Overall balance assessment: Needs assistance   Sitting balance-Leahy Scale: Good       Standing balance-Leahy Scale: Fair Standing balance comment: Can static stand and take steps without UE support                             Pertinent Vitals/Pain Pain Assessment: Faces Faces Pain Scale: Hurts a little bit Pain Location: Stomach, back Pain Descriptors / Indicators: Discomfort Pain Intervention(s): Monitored during session    Home Living Family/patient expects to be discharged to:: Private residence Living Arrangements: Spouse/significant other Available Help at Discharge: Family;Available 24 hours/day Type of Home: House Home Access: Stairs to enter Entrance Stairs-Rails: Right Entrance Stairs-Number of Steps: 3-6 Home Layout: One level Home Equipment: Walker - 2 wheels;Cane - single point;Shower seat Additional Comments: Wife available 24/7, but ambulates with SPC and reports poor balance. Has children and grandchildren who can assist PRN    Prior Function Level of Independence: Needs assistance   Gait / Transfers Assistance Needed: Independent with ambulation Drives daily, by himself; wife worries he does not always know which road he is  on. Wife reports no other falls in past 3 months  ADL's / Homemaking Assistance Needed: Independent with showering, toileting, dressing  Comments: Called wife who was able to verify home set-up     Hand Dominance        Extremity/Trunk Assessment   Upper Extremity  Assessment Upper Extremity Assessment: Overall WFL for tasks assessed    Lower Extremity Assessment Lower Extremity Assessment: Generalized weakness       Communication   Communication: HOH  Cognition Arousal/Alertness: Awake/alert Behavior During Therapy: WFL for tasks assessed/performed Overall Cognitive Status: History of cognitive impairments - at baseline Area of Impairment: Orientation;Attention;Memory;Following commands;Safety/judgement;Awareness;Problem solving                 Orientation Level: Disoriented to;Place;Situation Current Attention Level: Sustained;Selective Memory: Decreased short-term memory Following Commands: Follows one step commands consistently Safety/Judgement: Decreased awareness of safety;Decreased awareness of deficits Awareness: Intellectual Problem Solving: Requires verbal cues General Comments: H/o dementia. Reoriented to place (pt aware in [place]), but pt unable to recall at end of session. Frequent cues for safety and task      General Comments General comments (skin integrity, edema, etc.): Called wife Gloriajean Dell) at end of session to verify information and support available at home; family able to provide necessary assist and DME. Wife reports pt refuses to use SPC/RW at home. Educ wife that pt not safe to drive with level of dementia, or at least not to drive alone    Exercises     Assessment/Plan    PT Assessment Patient needs continued PT services  PT Problem List Decreased strength;Decreased activity tolerance;Decreased balance;Decreased mobility;Decreased cognition;Decreased knowledge of use of DME;Decreased safety awareness       PT Treatment Interventions DME instruction;Gait training;Stair training;Functional mobility training;Therapeutic activities;Therapeutic exercise;Balance training;Cognitive remediation;Patient/family education    PT Goals (Current goals can be found in the Care Plan section)  Acute Rehab PT  Goals Patient Stated Goal: "Can I go for a walk outside?" PT Goal Formulation: With patient Time For Goal Achievement: 10/08/20 Potential to Achieve Goals: Good    Frequency Min 3X/week   Barriers to discharge        Co-evaluation               AM-PAC PT "6 Clicks" Mobility  Outcome Measure Help needed turning from your back to your side while in a flat bed without using bedrails?: None Help needed moving from lying on your back to sitting on the side of a flat bed without using bedrails?: A Little Help needed moving to and from a bed to a chair (including a wheelchair)?: A Little Help needed standing up from a chair using your arms (e.g., wheelchair or bedside chair)?: A Little Help needed to walk in hospital room?: A Little Help needed climbing 3-5 steps with a railing? : A Little 6 Click Score: 19    End of Session Equipment Utilized During Treatment: Gait belt Activity Tolerance: Patient tolerated treatment well Patient left: in chair;with call bell/phone within reach;with chair alarm set Nurse Communication: Mobility status PT Visit Diagnosis: Other abnormalities of gait and mobility (R26.89);Muscle weakness (generalized) (M62.81)    Time: 5621-3086 PT Time Calculation (min) (ACUTE ONLY): 35 min   Charges:   PT Evaluation $PT Eval Moderate Complexity: 1 Mod PT Treatments $Gait Training: 8-22 mins   Ina Homes, PT, DPT Acute Rehabilitation Services  Pager [phone] Office [phone]  Malachy Chamber 09/24/2020, 10:08 AM

## 2020-09-24 NOTE — Evaluation (Signed)
Occupational Therapy Evaluation Patient Details Name: Randy Mason MRN: [id number] DOB: 1933/02/04 Today's Date: 09/24/2020    History of Present Illness Pt is an 87 y.o. male admitted 09/23/20 after being found down outside; pt reports tripping on uneven grass and unable to get up, pt denies LOC. Workup for rhabdomyolysis, SIRS. Head CT negative. CXR negative. PMH includes fall with C1 compression fx and PE (2020), dementia.   Clinical Impression   Pt presents with decline in function and safety with ADLs and ADL mobility with impaired strength, balance, endurance and cognition (hx of dementia). PTA, pt lived at home with his wife and was Ind with ADLs/selfcare, uses cane for mobility and was driving. Pt currently requires mod A with LB ADLs, min guard A with UB ADLs/grooming, mod A with toileting and min - min guard A with mobility using RW. Pt requires frequent cues for safety during tasks. Pt would benefit from acute OT services to address impairments to maximize level of function and safety    Follow Up Recommendations  Home health OT;Supervision/Assistance - 24 hour    Equipment Recommendations  None recommended by OT    Recommendations for Other Services       Precautions / Restrictions Precautions Precautions: Fall Restrictions Weight Bearing Restrictions: No      Mobility Bed Mobility Overal bed mobility: Needs Assistance Bed Mobility: Supine to Sit;Sit to Supine     Supine to sit: Min assist;HOB elevated Sit to supine: Min assist   General bed mobility comments: min for LE mgt and trunk elevation, LEs back onto bed. pt able to scoot hips over to center self in bed with cues    Transfers Overall transfer level: Needs assistance Equipment used: None;Rolling walker (2 wheeled) Transfers: Sit to/from Stand Sit to Stand: Min assist;Min guard              Balance Overall balance assessment: Needs assistance Sitting-balance support: No upper extremity  supported;Feet supported Sitting balance-Leahy Scale: Good     Standing balance support: Bilateral upper extremity supported;During functional activity Standing balance-Leahy Scale: Poor                             ADL either performed or assessed with clinical judgement   ADL Overall ADL's : Needs assistance/impaired Eating/Feeding: Set up;Supervision/ safety;Sitting   Grooming: Wash/dry hands;Wash/dry face;Min guard;Cueing for safety;Standing   Upper Body Bathing: Min guard;Sitting   Lower Body Bathing: Moderate assistance   Upper Body Dressing : Min guard;Sitting   Lower Body Dressing: Moderate assistance   Toilet Transfer: Minimal assistance;Min guard;Ambulation;RW;Cueing for safety;Cueing for sequencing   Toileting- Clothing Manipulation and Hygiene: Moderate assistance;Sit to/from stand;Cueing for sequencing       Functional mobility during ADLs: Minimal assistance;Min guard;Cueing for safety;Cueing for sequencing;Rolling walker       Vision Patient Visual Report: No change from baseline       Perception     Praxis      Pertinent Vitals/Pain Pain Assessment: Faces Faces Pain Scale: Hurts a little bit Pain Location: Stomach, back Pain Descriptors / Indicators: Discomfort;Grimacing Pain Intervention(s): Monitored during session;Repositioned     Hand Dominance Right   Extremity/Trunk Assessment Upper Extremity Assessment Upper Extremity Assessment: Generalized weakness   Lower Extremity Assessment Lower Extremity Assessment: Defer to PT evaluation   Cervical / Trunk Assessment Cervical / Trunk Assessment: Normal   Communication Communication Communication: HOH   Cognition Arousal/Alertness: Awake/alert Behavior During Therapy: WFL for  tasks assessed/performed Overall Cognitive Status: History of cognitive impairments - at baseline Area of Impairment: Orientation;Attention;Memory;Following commands;Safety/judgement;Awareness;Problem  solving                 Orientation Level: Disoriented to;Place;Situation   Memory: Decreased short-term memory Following Commands: Follows one step commands consistently Safety/Judgement: Decreased awareness of safety;Decreased awareness of deficits   Problem Solving: Requires verbal cues;Requires tactile cues General Comments: H/o dementia.  Frequent cues for task safety   General Comments       Exercises     Shoulder Instructions      Home Living Family/patient expects to be discharged to:: Private residence Living Arrangements: Spouse/significant other Available Help at Discharge: Family;Available 24 hours/day Type of Home: House Home Access: Stairs to enter [employer] of Steps: 3-6 Entrance Stairs-Rails: Right Home Layout: One level     Bathroom Shower/Tub: Chief Strategy Officer: Standard     Home Equipment: Environmental consultant - 2 wheels;Cane - single point;Shower seat   Additional Comments: Wife available 24/7, but ambulates with SPC and reports poor balance. Has children and grandchildren who can assist PRN      Prior Functioning/Environment    Gait / Transfers Assistance Needed: Independent with ambulation Drives daily, by himself; wife worries he does not always know which road he is on. Wife reports no other falls in past 3 months ADL's / Homemaking Assistance Needed: Independent with showering, toileting, dressing            OT Problem List: Decreased strength;Impaired balance (sitting and/or standing);Decreased cognition;Decreased safety awareness;Decreased activity tolerance;Decreased knowledge of use of DME or AE      OT Treatment/Interventions: Self-care/ADL training;Patient/family education;Balance training;Therapeutic activities;DME and/or AE instruction    OT Goals(Current goals can be found in the care plan section) Acute Rehab OT Goals Patient Stated Goal: 'take a nap" OT Goal Formulation: With patient Time For Goal  Achievement: 10/08/20 Potential to Achieve Goals: Good ADL Goals Pt Will Perform Grooming: with supervision;with set-up;standing;with caregiver independent in assisting Pt Will Perform Upper Body Bathing: with set-up;with modified independence;with caregiver independent in assisting;sitting Pt Will Perform Lower Body Bathing: with min assist;with min guard assist;with caregiver independent in assisting Pt Will Perform Upper Body Dressing: with supervision;with set-up;with caregiver independent in assisting Pt Will Transfer to Toilet: with min guard assist;with supervision;ambulating Pt Will Perform Toileting - Clothing Manipulation and hygiene: with min assist;with min guard assist;with caregiver independent in assisting;sit to/from stand  OT Frequency: Min 2X/week   Barriers to D/C:            Co-evaluation              AM-PAC OT "6 Clicks" Daily Activity     Outcome Measure Help from another person eating meals?: None Help from another person taking care of personal grooming?: A Little Help from another person toileting, which includes using toliet, bedpan, or urinal?: A Lot Help from another person bathing (including washing, rinsing, drying)?: A Lot Help from another person to put on and taking off regular upper body clothing?: A Little Help from another person to put on and taking off regular lower body clothing?: A Lot 6 Click Score: 16   End of Session Equipment Utilized During Treatment: Gait belt;Rolling walker  Activity Tolerance: Patient limited by fatigue Patient left: in bed;with call bell/phone within reach;with bed alarm set  OT Visit Diagnosis: Unsteadiness on feet (R26.81);Other abnormalities of gait and mobility (R26.89);Muscle weakness (generalized) (M62.81);Other symptoms and signs involving cognitive function;Pain Pain -  part of body:  (back)                Time: 3009-2330 OT Time Calculation (min): 25 min Charges:  OT General Charges $OT Visit: 1  Visit OT Evaluation $OT Eval Moderate Complexity: 1 Mod OT Treatments $Self Care/Home Management : 8-22 mins    Galen Manila 09/24/2020, 3:19 PM

## 2020-09-24 NOTE — TOC Progression Note (Addendum)
Transition of Care [facility]) - Progression Note    Patient Details  Name: Randy Mason MRN: [id number] Date of Birth: Jan 26, 1933  [facility] CM/SW Contact  Leone Haven, RN Phone Number: 09/24/2020, 4:41 PM  Clinical Narrative:    NCM spoke with patient, states I can talk to wife.  NCM contacted wife, offered choice for HHPT, HHOT. She states they have used [facility] in the past would like to use them again, if they can't take then she does not have a preference.  Patient has PCP. Dr. Clelia Croft, he has cane and walker and w/chair at home.  Daughter takes him to his MD apts.  Awaiting to hear back from [place] with [facility].   5/4- NCM contacted [facility] Grippe , she has not heard back yet,  NCM made referral to [facility] with [facility] for HHPT, HHOT, awaiting to hear back from her. Per [place] they are at capacity.  Still awaiting to hear from [facility]  NCM made referral to [facility] with [facility], he is able to take referral for HHOT, HHPT, soc will begin 24 to 48 hrs post dc..        Expected Discharge Plan and Services                                                 Social Determinants of Health (SDOH) Interventions    Readmission Risk Interventions No flowsheet data found.

## 2020-09-24 NOTE — Progress Notes (Addendum)
HOSPITAL MEDICINE OVERNIGHT EVENT NOTE    Notified by radiology that CT angiogram of the chest performed earlier this evening has revealed signs of pulmonary embolism of segmental branch of right upper lobe as well as tiny subsegmental emboli present in the right middle lobe as well as another segmental branch pulmonary embolism to the posterior right lower lobe.  Notably, there is question as to evidence of right heart strain on the CT scan.  Patient is not hypotensive nor is he requiring any oxygen.  Echocardiogram performed earlier in the day on 5/3 reveals preserved right ventricular function.  Notably, patient has a history of pulmonary embolus.  We will initiate intravenous heparin infusion now.  Radiology also reports incidental finding of T7 fracture with question of extension of this fracture into the left pedicle and perhaps also into the right pedicle with concern for instability.  This is concerning considering the patient had a traumatic fall prior to presentation.  Radiology is recommending dedicated CT imaging of the thoracic spine which can be generated with reconstruction of the CT images from the CT angiogram of the chest.  We will place this order now and have radiology review it.  Based on the results of this dedicated imaging we will consider contacting neurosurgery this evening.    Per my discussions with nursing patient denies back pain.  Marinda Elk  MD [facility]   ADDENDUM (5/4 6am)  Review of reconstructed contrast images of thoracic spine have been interpreted by radiology.  Radiologist is now requesting that repeat CT imaging of the thoracic spine without contrast be performed to best determine if the pedicles are actually involved.  Per radiology's recommendation, I ordered this repeat CT imaging of the thoracic spine at 330AM.  Unfortunately patient has yet to go for these images.  Once these images have resulted if extension of T7 fracture into the  pedicles as confirmed suggestive of an unstable fracture then we will discuss with neurosurgery.  Nursing states the patient denies any back pain.  I have asked that patient remain on bedrest until we have completed imaging of the patient and we have potentially discussed the case with neurosurgery if necessary.  Deno Lunger Haskel Dewalt

## 2020-09-24 NOTE — Progress Notes (Signed)
*  PRELIMINARY RESULTS* Echocardiogram 2D Echocardiogram has been performed.  Neomia Dear RDCS 09/24/2020, 1:21 PM

## 2020-09-25 ENCOUNTER — Inpatient Hospital Stay (HOSPITAL_COMMUNITY): Payer: Medicare Other

## 2020-09-25 LAB — BASIC METABOLIC PANEL
Anion gap: 5 (ref 5–15)
BUN: 8 mg/dL (ref 8–23)
CO2: 27 mmol/L (ref 22–32)
Calcium: 8.4 mg/dL — ABNORMAL LOW (ref 8.9–10.3)
Chloride: 104 mmol/L (ref 98–111)
Creatinine, Ser: 0.78 mg/dL (ref 0.61–1.24)
GFR, Estimated: >60 mL/min (ref >60)
Glucose, Bld: 113 mg/dL — ABNORMAL HIGH (ref 70–99)
Potassium: 3.6 mmol/L (ref 3.5–5.1)
Sodium: 136 mmol/L (ref 135–145)

## 2020-09-25 LAB — URINE CULTURE: Culture: NO GROWTH

## 2020-09-25 LAB — CBC
HCT: 36.3 % — ABNORMAL LOW (ref 39.0–52.0)
Hemoglobin: 12.6 g/dL — ABNORMAL LOW (ref 13.0–17.0)
MCH: 33.2 pg (ref 26.0–34.0)
MCHC: 34.7 g/dL (ref 30.0–36.0)
MCV: 95.8 fL (ref 80.0–100.0)
Platelets: 141 10*3/uL — ABNORMAL LOW (ref 150–400)
RBC: 3.79 MIL/uL — ABNORMAL LOW (ref 4.22–5.81)
RDW: 11.9 % (ref 11.5–15.5)
WBC: 8.1 10*3/uL (ref 4.0–10.5)
nRBC: 0 % (ref 0.0–0.2)

## 2020-09-25 LAB — HEPARIN LEVEL (UNFRACTIONATED): Heparin Unfractionated: 0.16 IU/mL — ABNORMAL LOW (ref 0.30–0.70)

## 2020-09-25 LAB — CK: Total CK: 4791 U/L — ABNORMAL HIGH (ref 49–397)

## 2020-09-25 MED ORDER — APIXABAN 5 MG PO TABS
10.0000 mg | ORAL_TABLET | ORAL | Status: AC
  Administered 2020-09-25: 10 mg via ORAL

## 2020-09-25 MED ORDER — APIXABAN 5 MG PO TABS: 5.0000 mg | ORAL_TABLET | Freq: Two times a day (BID) | ORAL | Status: DC

## 2020-09-25 MED ORDER — HEPARIN BOLUS VIA INFUSION
3000.0000 [IU] | Freq: Once | INTRAVENOUS | Status: AC
  Administered 2020-09-25: 3000 [IU] via INTRAVENOUS
  Filled 2020-09-25: qty 3000

## 2020-09-25 MED ORDER — APIXABAN 5 MG PO TABS
10.0000 mg | ORAL_TABLET | Freq: Two times a day (BID) | ORAL | Status: DC
  Administered 2020-09-26 (×2): 10 mg via ORAL
  Filled 2020-09-25 (×3): qty 2

## 2020-09-25 NOTE — Progress Notes (Signed)
ANTICOAGULATION CONSULT NOTE - Follow Up Consult  Pharmacy Consult for Heparin Indication: pulmonary embolus  No Known Allergies  Patient Measurements: Height: 6\' 1"  (185.4 cm) Weight: 104.5 kg (230 lb 6.1 oz) IBW/kg (Calculated) : 79.9 Heparin Dosing Weight:  101.4  Vital Signs: Temp: 98.6 F (37 C) (05/04 0521) Temp Source: Oral (05/04 0521) BP: 152/83 (05/04 0521) Pulse Rate: 86 (05/04 0521)  Labs: Recent Labs    09/23/20 2227 09/24/20 0031 09/25/20 0705  HGB 13.2 12.9* 12.6*  HCT 39.2 37.9* 36.3*  PLT 161 146* 141*  HEPARINUNFRC  --   --  0.16*  CREATININE 0.90 0.94 0.78  CKTOTAL 6,882* 6,960*  --   TROPONINIHS 68* 69*  --     Estimated Creatinine Clearance: 84.1 mL/min (by C-G formula based on SCr of 0.78 mg/dL).  Assessment: Anticoag: IV heparin for PE.  - Hep level 0.16 low. Hgb 12.6. Plts 141 low normal. 5/3: Dopplers negative for DVT  Goal of Therapy:  Heparin level 0.3-0.7 units/ml Monitor platelets by anticoagulation protocol: Yes   Plan:  IV heparin 3000 unit IV bolus Increase heparin infusion to 1900 units/hr Recheck heparin level in in 8hrs Daily HL and CBC   Letoya Stallone S. 11/25/20, PharmD, BCPS Clinical Staff Pharmacist [email or web address] Merilynn Finland, Zacharia Sowles Stillinger 09/25/2020,8:28 AM

## 2020-09-25 NOTE — Progress Notes (Signed)
PT Cancellation Note  Patient Details Name: Randy Mason MRN: [id number] DOB: [birth year]   Cancelled Treatment:    Reason Eval/Treat Not Completed: Medical issues which prohibited therapy. Pt with new PE started on IV heparin, PT will hold until heparin levels are within therapeutic ranged.   Arlyss Gandy 09/25/2020, 1:10 PM

## 2020-09-25 NOTE — Progress Notes (Signed)
Orthopedic Tech Progress Note Patient Details:  Randy Mason Sep 27, 1932 [id number] Dropped off TLSO brace to patient room Ortho Devices Type of Ortho Device: Other (comment) Ortho Device/Splint Location: BACK Ortho Device/Splint Interventions: Ordered   Post Interventions Patient Tolerated: Well Instructions Provided: Care of device   Donald Pore 09/25/2020, 2:12 PM

## 2020-09-25 NOTE — Progress Notes (Signed)
ANTICOAGULATION CONSULT NOTE - Initial Consult  Pharmacy Consult for Eliquis Indication: pulmonary embolus  No Known Allergies  Patient Measurements: Height: 6\' 1"  (185.4 cm) Weight: 104.5 kg (230 lb 6.1 oz) IBW/kg (Calculated) : 79.9  Vital Signs: Temp: 98.1 F (36.7 C) (05/04 1119) Temp Source: Oral (05/04 1119) BP: 153/77 (05/04 1119) Pulse Rate: 78 (05/04 1119)  Labs: Recent Labs    09/23/20 2227 09/24/20 0031 09/25/20 0705  HGB 13.2 12.9* 12.6*  HCT 39.2 37.9* 36.3*  PLT 161 146* 141*  HEPARINUNFRC  --   --  0.16*  CREATININE 0.90 0.94 0.78  CKTOTAL 6,882* 6,960* 4,791*  TROPONINIHS 68* 69*  --     Estimated Creatinine Clearance: 84.1 mL/min (by C-G formula based on SCr of 0.78 mg/dL).   Medical History: Past Medical History:  Diagnosis Date  . BPH (benign prostatic hyperplasia)   . Compression fracture of C1 vertebra (HCC)   . History of ITP 2012  . Hyperbilirubinemia   . MCI (mild cognitive impairment)   . Pulmonary embolism (HCC)     Assessment: CC/HPI: Fall 5/1 now with rhabdo, elevated troponin, SIRS,   PMH: MCI, BPH, ITP in 2012, mild hyperbilirubinemia, C1 compression fx in 2020, incidental finding of PE at same time he was admitted for the fall, dementia  Anticoag: IV heparin for PE.  - Hep level 0.16 low. Hgb 12.6. Plts 141 low normal. Transition to Eliquis on 5/4 5/3: Dopplers negative for DVT  Goal of Therapy:  Therapeutic oral anticoagulation   Plan:  D/c IV heparin Eliquis 10mg  BID x 7d, then 5mg  BID No surgery planned for T7 fx.   Aniket Paye S. 7/3, PharmD, BCPS Clinical Staff Pharmacist [email or web address] , Marty Uy Stillinger 09/25/2020,2:43 PM

## 2020-09-25 NOTE — Consult Note (Addendum)
Reason for Consult: T7 fx Referring Physician: hospitalist  Randy Mason is an 87 y.o. male.   HPI:  87 year old male was admitted 3 days ago after sustaining a fall. Patient denies any back pain. Has a history of multiple back fracture and a C1 fracture in 2020. He was transferred her from [facility] with elevated troponins. Denies any NT or weakness in his legs. Patient seems to be a little confused during our exam today and thinks he is going home today. He has not been out of bed to ambulate yet. Rates his pain a 0/10.  Past Medical History:  Diagnosis Date  . BPH (benign prostatic hyperplasia)   . Compression fracture of C1 vertebra (HCC)   . History of ITP 2012  . Hyperbilirubinemia   . MCI (mild cognitive impairment)   . Pulmonary embolism [facility])     Past Surgical History:  Procedure Laterality Date  . URETHRA SURGERY      No Known Allergies  Social History   Tobacco Use  . Smoking status: Never Smoker  . Smokeless tobacco: Not on file  Substance Use Topics  . Alcohol use: Yes    Alcohol/week: 1.0 - 4.0 standard drink    Types: 1 - 4 Standard drinks or equivalent per week    Family History  Problem Relation Age of Onset  . Deep vein thrombosis Father   . Anesthesia problems Neg Hx   . Clotting disorder Neg Hx      Review of Systems  Positive ROS: as above  All other systems have been reviewed and were otherwise negative with the exception of those mentioned in the HPI and as above.  Objective: Vital signs in last 24 hours: Temp:  [98.1 F (36.7 C)-99.4 F (37.4 C)] 98.1 F (36.7 C) (05/04 1119) Pulse Rate:  [78-91] 78 (05/04 1119) Resp:  [16-20] 19 (05/04 1119) BP: (150-153)/(77-83) 153/77 (05/04 1119) SpO2:  [95 %-96 %] 96 % (05/04 1119) Weight:  [104.5 kg] 104.5 kg (05/04 0009)  General Appearance: Alert, cooperative, no distress, appears stated age Head: Normocephalic, without obvious abnormality, atraumatic Eyes: PERRL, conjunctiva/corneas clear,  EOM's intact, fundi benign, both eyes      Lungs:  respirations unlabored Heart: Regular rate and rhythm Pulses: 2+ and symmetric all extremities Skin: Skin color, texture, turgor normal, no rashes or lesions  NEUROLOGIC:   Mental status: A&O x4, no aphasia, good attention span, Memory and fund of knowledge Motor Exam - grossly normal, normal tone and bulk Sensory Exam - grossly normal Reflexes: symmetric, no pathologic reflexes, No Hoffman's, No clonus Coordination - grossly normal Gait - not tested Balance - not tested Cranial Nerves: I: smell Not tested  II: visual acuity  OS: na    OD: na  II: visual fields Full to confrontation  II: pupils Equal, round, reactive to light  III,VII: ptosis None  III,IV,VI: extraocular muscles  Full ROM  V: mastication   V: facial light touch sensation    V,VII: corneal reflex    VII: facial muscle function - upper    VII: facial muscle function - lower   VIII: hearing   IX: soft palate elevation    IX,X: gag reflex   XI: trapezius strength    XI: sternocleidomastoid strength   XI: neck flexion strength    XII: tongue strength      Data Review Lab Results  Component Value Date   WBC 8.1 09/25/2020   HGB 12.6 (L) 09/25/2020   HCT  36.3 (L) 09/25/2020   MCV 95.8 09/25/2020   PLT 141 (L) 09/25/2020   Lab Results  Component Value Date   NA 136 09/25/2020   K 3.6 09/25/2020   CL 104 09/25/2020   CO2 27 09/25/2020   BUN 8 09/25/2020   CREATININE 0.78 09/25/2020   GLUCOSE 113 (H) 09/25/2020   Lab Results  Component Value Date   INR 1.12 01/10/2011    Radiology: CT ANGIO CHEST PE W OR WO CONTRAST  Addendum Date: 09/24/2020   ADDENDUM REPORT: 09/24/2020 21:54 ADDENDUM: Critical Value/emergent results were called by telephone at the time of interpretation on 09/24/2020 at 9:53 pm to provider Dr, Leafy Half , who verbally acknowledged these results. Electronically Signed   By: Donzetta Kohut M.D.   On: 09/24/2020 21:54   Result Date:  09/24/2020 CLINICAL DATA:  Suspected pulmonary embolism in a 87 year old male. EXAM: CT ANGIOGRAPHY CHEST WITH CONTRAST TECHNIQUE: Multidetector CT imaging of the chest was performed using the standard protocol during bolus administration of intravenous contrast. Multiplanar CT image reconstructions and MIPs were obtained to evaluate the vascular anatomy. CONTRAST:  75mL OMNIPAQUE IOHEXOL 350 MG/ML SOLN COMPARISON:  CT abdomen and pelvis of Sep 22, 2020 FINDINGS: Cardiovascular: Normal caliber thoracic aorta. Scattered atheromatous plaque. Top-normal heart size with small pericardial effusion. Calcified atheromatous changes of coronary vasculature. Signs of pulmonary embolus in RIGHT upper lobe pulmonary artery image 135/7 segmental branch to RIGHT upper lobe. Tiny subsegmental embolus may be present in RIGHT middle lobe on image 252/6 no main pulmonary arterial thrombus RIGHT lower lobe embolus in segmental branch to posterior RIGHT lower lobe (image 235, series 7 RV to LV ratio mildly elevated at upper limits of normal at 0.91. Mediastinum/Nodes: Patulous esophagus. No axillary, mediastinal or hilar lymphadenopathy. Lungs/Pleura: Small effusions.  Basilar volume loss. Upper Abdomen: No acute upper abdominal findings. Musculoskeletal: Spinal degenerative changes with signs of ankylosing spondylitis and multilevel spinal fusion. Fracture of T7 vertebral body with horizontal orientation. Potential extension into the pedicles based on pattern and subtle lucency extending posteriorly. Posterior elements maintain normal relationships. Review of the MIP images confirms the above findings. IMPRESSION: 1. Signs of pulmonary arterial thrombus in RIGHT upper lobe pulmonary artery 135/7 segmental branch to RIGHT upper lobe. Tiny subsegmental embolus may be present in RIGHT middle lobe on /6/6 no main pulmonary arterial thrombus. RIGHT lower lobe segmental branch to posterior RIGHT lower lobe (image 235, series 7 (image 235,  series 7 with evidence of right heart strain (RV to LV ratio mildly elevated at upper limits of normal at 0.91.) could consider echocardiographic correlation, findings are of uncertain significance given peripheral nature of small emboli. 2. T7 fracture with horizontal pattern may extend into LEFT pedicle and perhaps into RIGHT pedicle. Based on pattern of fracture there is high suspicion for instability at this level, also associated with multiple levels of fusion. Dedicated imaging of the thoracic spine or reconstructed bone algorithm of existing data may be helpful to better evaluate this patient. 3. Small pericardial effusion. 4. Small effusions. 5. Aortic atherosclerosis. A call is out to the referring provider to further discuss findings in the above case. Electronically Signed: By: Donzetta Kohut M.D. On: 09/24/2020 21:28   CT T-SPINE NO CHARGE  Result Date: 09/25/2020 CLINICAL DATA:  Thoracic spine fracture EXAM: CT Thoracic Spine without contrast TECHNIQUE: Multiplanar CT images of the thoracic spine were reconstructed from contemporary CT of the Chest. CONTRAST:  No additional contrast administered COMPARISON:  None FINDINGS: Alignment: Normal Vertebrae:  There is diffuse ankylosis of the thoracic spine. There is an acute, nondisplaced fracture of T7 that traverses the vertebral body from anterior to posterior. Visualization is poor due to decreased signal to noise ratio caused by the contrast bolus in the superior vena cava. Assessment of the pedicles is limited. Paraspinal and other soft tissues: Negative. Disc levels: No spinal canal stenosis. IMPRESSION: 1. Acute, nondisplaced fracture of T7 with limited visualization due to decreased signal to noise ratio caused by the contrast bolus in the superior vena cava. Repeating the study without contrast would provide the clearest answer as to whether there is extension to the pedicles. 2. Diffuse ankylosis of the thoracic spine. Electronically Signed   By:  Deatra Robinson M.D.   On: 09/25/2020 00:20   ECHOCARDIOGRAM COMPLETE  Result Date: 09/24/2020    ECHOCARDIOGRAM REPORT   Patient Name:   Randy Mason Date of Exam: 09/24/2020 Medical Rec #:  [id number]           Height:       73.0 in Accession #:    [id number]          Weight:       231.3 lb Date of Birth:  01-Apr-1933           BSA:          2.289 m Patient Age:    88 years            BP:           149/90 mmHg Patient Gender: M                   HR:           96 bpm. Exam Location:  Inpatient Procedure: 2D Echo, Cardiac Doppler and Color Doppler Indications:    Syncope, elevated troponin  History:        Patient has no prior history of Echocardiogram examinations.  Sonographer:    Neomia Dear RDCS Referring Phys: 60 JARED M GARDNER  Sonographer Comments: Patient is morbidly obese. Image acquisition challenging due to patient body habitus. IMPRESSIONS  1. Left ventricular ejection fraction, by estimation, is 60 to 65%. The left ventricle has normal function. The left ventricle has no regional wall motion abnormalities. There is mild left ventricular hypertrophy. Left ventricular diastolic parameters are consistent with Grade I diastolic dysfunction (impaired relaxation).  2. Right ventricular systolic function is normal. The right ventricular size is mildly enlarged. There is normal pulmonary artery systolic pressure.  3. The mitral valve is normal in structure. No evidence of mitral valve regurgitation. No evidence of mitral stenosis.  4. The aortic valve is calcified. There is mild calcification of the aortic valve. There is mild thickening of the aortic valve. Aortic valve regurgitation is not visualized. Mild to moderate aortic valve sclerosis/calcification is present, without any evidence of aortic stenosis. Aortic valve mean gradient measures 3.0 mmHg. Aortic valve Vmax measures 1.21 m/s.  5. There is borderline dilatation of the ascending aorta, measuring 38 mm.  6. The inferior vena cava is normal in  size with greater than 50% respiratory variability, suggesting right atrial pressure of 3 mmHg. FINDINGS  Left Ventricle: Left ventricular ejection fraction, by estimation, is 60 to 65%. The left ventricle has normal function. The left ventricle has no regional wall motion abnormalities. The left ventricular internal cavity size was normal in size. There is  mild left ventricular hypertrophy. Left ventricular diastolic parameters are consistent with Grade I diastolic  dysfunction (impaired relaxation). Right Ventricle: The right ventricular size is mildly enlarged. No increase in right ventricular wall thickness. Right ventricular systolic function is normal. There is normal pulmonary artery systolic pressure. The tricuspid regurgitant velocity is 2.28  m/s, and with an assumed right atrial pressure of 3 mmHg, the estimated right ventricular systolic pressure is 23.8 mmHg. Left Atrium: Left atrial size was normal in size. Right Atrium: Right atrial size was normal in size. Pericardium: There is no evidence of pericardial effusion. Mitral Valve: The mitral valve is normal in structure. No evidence of mitral valve regurgitation. No evidence of mitral valve stenosis. Tricuspid Valve: The tricuspid valve is normal in structure. Tricuspid valve regurgitation is not demonstrated. No evidence of tricuspid stenosis. Aortic Valve: The aortic valve is calcified. There is mild calcification of the aortic valve. There is mild thickening of the aortic valve. Aortic valve regurgitation is not visualized. Mild to moderate aortic valve sclerosis/calcification is present, without any evidence of aortic stenosis. Aortic valve mean gradient measures 3.0 mmHg. Aortic valve peak gradient measures 5.9 mmHg. Aortic valve area, by VTI measures 4.85 cm. Pulmonic Valve: The pulmonic valve was normal in structure. Pulmonic valve regurgitation is not visualized. No evidence of pulmonic stenosis. Aorta: The aortic root is normal in size and  structure. There is borderline dilatation of the ascending aorta, measuring 38 mm. Venous: The inferior vena cava is normal in size with greater than 50% respiratory variability, suggesting right atrial pressure of 3 mmHg. IAS/Shunts: No atrial level shunt detected by color flow Doppler.  LEFT VENTRICLE PLAX 2D LVIDd:         2.80 cm LVIDs:         1.80 cm LV PW:         1.30 cm LV IVS:        1.00 cm LVOT diam:     2.60 cm LV SV:         101 LV SV Index:   44 LVOT Area:     5.31 cm  LV Volumes (MOD) LV vol d, MOD A2C: 38.1 ml LV vol d, MOD A4C: 64.6 ml LV vol s, MOD A2C: 22.2 ml LV vol s, MOD A4C: 17.5 ml LV SV MOD A2C:     15.9 ml LV SV MOD A4C:     64.6 ml LV SV MOD BP:      30.0 ml  PULMONARY VEINS A Reversal Duration: 88.00 msec A Reversal Velocity: 40.20 cm/s Diastolic Velocity:  33.60 cm/s S/D Velocity:        1.20 Systolic Velocity:   39.40 cm/s LEFT ATRIUM             Index       RIGHT ATRIUM           Index LA Vol (A2C):   27.7 ml 12.10 ml/m RA Area:     16.60 cm LA Vol (A4C):   45.5 ml 19.88 ml/m RA Volume:   37.90 ml  16.56 ml/m LA Biplane Vol: 37.9 ml 16.56 ml/m  AORTIC VALVE                   PULMONIC VALVE AV Area (Vmax):    4.48 cm    PV Vmax:       1.32 m/s AV Area (Vmean):   4.66 cm    PV Vmean:      92.200 cm/s AV Area (VTI):     4.85 cm    PV VTI:  0.274 m AV Vmax:           121.00 cm/s PV Peak grad:  6.9 mmHg AV Vmean:          84.100 cm/s PV Mean grad:  4.0 mmHg AV VTI:            0.209 m AV Peak Grad:      5.9 mmHg AV Mean Grad:      3.0 mmHg LVOT Vmax:         102.00 cm/s LVOT Vmean:        73.800 cm/s LVOT VTI:          0.191 m LVOT/AV VTI ratio: 0.91  AORTA Ao Root diam: 3.90 cm Ao Asc diam:  3.80 cm MITRAL VALVE               TRICUSPID VALVE MV Area (PHT): 5.54 cm    TR Peak grad:   20.8 mmHg MV Decel Time: 137 msec    TR Vmax:        228.00 cm/s MV E velocity: 75.40 cm/s MV A velocity: 90.30 cm/s  SHUNTS MV E/A ratio:  0.83        Systemic VTI:  0.19 m                             Systemic Diam: 2.60 cm Donato Schultz MD Electronically signed by Donato Schultz MD Signature Date/Time: 09/24/2020/3:12:44 PM    Final    VAS [place] LOWER EXTREMITY VENOUS (DVT)  Result Date: 09/24/2020  Lower Venous DVT Study Patient Name:  Randy Mason  Date of Exam:   09/24/2020 Medical Rec #: [id number]            Accession #:    [id number] Date of Birth: 1933/05/16            Patient Gender: M Patient Age:   88Y Exam Location:  [facility] Procedure:      VAS [place] LOWER EXTREMITY VENOUS (DVT) Referring Phys: [id number] [facility] MIKHAIL --------------------------------------------------------------------------------  Indications: Elevated ddimer.  Comparison Study: no prior Performing Technologist: Blanch Media RVS  Examination Guidelines: A complete evaluation includes B-mode imaging, spectral Doppler, color Doppler, and power Doppler as needed of all accessible portions of each vessel. Bilateral testing is considered an integral part of a complete examination. Limited examinations for reoccurring indications may be performed as noted. The reflux portion of the exam is performed with the patient in reverse Trendelenburg.  +---------+---------------+---------+-----------+----------+-------------------+ RIGHT    CompressibilityPhasicitySpontaneityPropertiesThrombus Aging      +---------+---------------+---------+-----------+----------+-------------------+ CFV      Full           Yes      Yes                                      +---------+---------------+---------+-----------+----------+-------------------+ SFJ      Full                                                             +---------+---------------+---------+-----------+----------+-------------------+ FV Prox  Full                                                             +---------+---------------+---------+-----------+----------+-------------------+  FV Mid   Full                                                              +---------+---------------+---------+-----------+----------+-------------------+ FV DistalFull                                                             +---------+---------------+---------+-----------+----------+-------------------+ PFV      Full                                                             +---------+---------------+---------+-----------+----------+-------------------+ POP      Full           Yes      Yes                                      +---------+---------------+---------+-----------+----------+-------------------+ PTV      Full                                                             +---------+---------------+---------+-----------+----------+-------------------+ PERO                                                  Not well visualized +---------+---------------+---------+-----------+----------+-------------------+   +---------+---------------+---------+-----------+----------+-------------------+ LEFT     CompressibilityPhasicitySpontaneityPropertiesThrombus Aging      +---------+---------------+---------+-----------+----------+-------------------+ CFV      Full           Yes      Yes                                      +---------+---------------+---------+-----------+----------+-------------------+ SFJ      Full                                                             +---------+---------------+---------+-----------+----------+-------------------+ FV Prox  Full                                                             +---------+---------------+---------+-----------+----------+-------------------+ FV Mid   Full                                                             +---------+---------------+---------+-----------+----------+-------------------+  FV DistalFull                                                              +---------+---------------+---------+-----------+----------+-------------------+ PFV      Full                                                             +---------+---------------+---------+-----------+----------+-------------------+ POP      Full           Yes      Yes                                      +---------+---------------+---------+-----------+----------+-------------------+ PTV      Full                                                             +---------+---------------+---------+-----------+----------+-------------------+ PERO                                                  Not well visualized +---------+---------------+---------+-----------+----------+-------------------+     Summary: BILATERAL: - No evidence of deep vein thrombosis seen in the lower extremities, bilaterally. - No evidence of superficial venous thrombosis in the lower extremities, bilaterally. -No evidence of popliteal cyst, bilaterally.   *See table(s) above for measurements and observations. Electronically signed by Gretta Began MD on 09/24/2020 at 8:17:05 PM.    Final      Assessment/Plan: 87 year old patient admitted after a fall at home. CT showed a partial chance fracture through the T7 vertebral body with no posterior element involvement. Patient is not complaining of any pain at all. I do not think he needs any surgical intervention at this time. We will brace him in a TLSO when he is out of bed. Will need serial xrays outpatient over the next couple months to see how he is healing.    Tiana Loft Yee Gangi 09/25/2020 1:02 PM

## 2020-09-25 NOTE — Progress Notes (Signed)
PROGRESS NOTE    REBECCA MOTTA  GEX:[id number] DOB: 1933/04/29 DOA: 09/23/2020 PCP: [name], MD     Brief Narrative:  LAKYN MANTIONE is a 87 y.o.malewith medical history significant ofMCI, BPH, ITP in 2012, mild hyperbilirubinemia, C1 compression fx in 2020, incidental finding of PE at same time he was admitted for the fall and C1 compression fx in 2020 (at time of admission).  He is no longer on anticoagulation. On 5/1 pt felt he was stuck in house all day and decided to go outside for a walk. He reports that due to uneven grass, his foot got caught, he tripped, fell, and was unable to get up. After a couple of hours, wife called EMS. Pt was found down outside in storm and brought to ED at [facility]. He was admitted with acute rhabdomyolysis and started on IVF.   New events last 24 hours / Subjective: Patient laying in bed, denies any pain.  He specifically denies any back pain, but does admit to some neck pain.  He is unable to tell me any history of presenting illness, is oriented to self and year only.  Assessment & Plan:   Principal Problem:   Rhabdomyolysis Active Problems:   Elevated troponin   SIRS (systemic inflammatory response syndrome) (HCC)   MCI (mild cognitive impairment) with memory loss   Acute rhabdomyolysis  -Likely secondary to fall and being found down for 3-4 hours per wife -CPK improving -IVF   PE -Has history of PE, has used eliquis in the past (used xarelto at one point but was switched to eliquis per wife) -DVT US negative  -Echo without evidence of heart strain -IV Heparin --> eliquis  -On room air   Nondisplaced T7 vertebral body fracture -Neurosurgery consulted   Elevated troponin -Secondary to demand ischemia, PE. Troponin 68, 69  Fall -CT head at outside facility showed no acute intercranial process -CT cervical spine without acute fracture -PT recommended home health  Mild cognitive impairment -Per discussion with wife,  patient has been diagnosed with dementia and was placed on demetia pill however patient did not take this medication given side effects -Currently he is alert and oriented x2    DVT prophylaxis: IV heparin   Code Status:     Code Status Orders  (From admission, onward)         Start     Ordered   09/23/20 2223  Full code  Continuous        09/23/20 2223        Code Status History    This patient has a current code status but no historical code status.   Advance Care Planning Activity     Family Communication: Wife over the phone Disposition Plan:  Status is: Inpatient  Remains inpatient appropriate because:Inpatient level of care appropriate due to severity of illness   Dispo: The patient is from: Home              Anticipated d/c is to: Home              Patient currently is not medically stable to d/c.   Difficult to place patient No     Antimicrobials:  Anti-infectives (From admission, onward)   None        Objective: Vitals:   09/24/20 1924 09/25/20 0009 09/25/20 0521 09/25/20 1119  BP: (!) 150/78  (!) 152/83 (!) 153/77  Pulse: 91  86 78  Resp: Temp: 99.4  F (37.4 C)  98.6 F (37 C) 98.1 F (36.7 C)  TempSrc: Oral  Oral Oral  SpO2: 95%  96% 96%  Weight:  104.5 kg    Height:        Intake/Output Summary (Last 24 hours) at 09/25/2020 1259 Last data filed at 09/25/2020 1120 Gross per 24 hour  Intake 5271.13 ml  Output 3200 ml  Net 2071.13 ml   Filed Weights   09/23/20 2200 09/24/20 0107 09/25/20 0009  Weight: 105.1 kg 104.9 kg 104.5 kg    Examination:  General exam: Appears calm and comfortable  Respiratory system: Clear to auscultation. Respiratory effort normal. No respiratory distress. No conversational dyspnea.  Cardiovascular system: S1 & S2 heard, RRR. No murmurs. No pedal edema. Gastrointestinal system: Abdomen is nondistended, soft and nontender. Normal bowel sounds heard. Central nervous system: Alert and oriented to  self and year only. No focal neurological deficits. Speech clear.  Extremities: Symmetric in appearance  Skin: No rashes, lesions or ulcers on exposed skin  Psychiatry: Judgement and insight appear poor, poor historian overall  Data Reviewed: I have personally reviewed following labs and imaging studies  CBC: Recent Labs  Lab 09/23/20 2227 09/24/20 0031 09/25/20 0705  WBC 11.3* 10.2 8.1  HGB 13.2 12.9* 12.6*  HCT 39.2 37.9* 36.3*  MCV 97.5 96.4 95.8  PLT 161 146* 141*   Basic Metabolic Panel: Recent Labs  Lab 09/23/20 2227 09/24/20 0031 09/25/20 0705  NA 134* 134* 136  K 4.0 4.0 3.6  CL 104 104 104  CO2 22 23 27   GLUCOSE 117* 121* 113*  BUN 13 12 8   CREATININE 0.90 0.94 0.78  CALCIUM 8.6* 8.6* 8.4*   GFR: Estimated Creatinine Clearance: 84.1 mL/min (by C-G formula based on SCr of 0.78 mg/dL). Liver Function Tests: Recent Labs  Lab 09/23/20 2227  AST 187*  ALT 49*  ALKPHOS 47  BILITOT 2.0*  PROT 6.3*  ALBUMIN 3.2*   No results for input(s): LIPASE, AMYLASE in the last 168 hours. No results for input(s): AMMONIA in the last 168 hours. Coagulation Profile: No results for input(s): INR, PROTIME in the last 168 hours. Cardiac Enzymes: Recent Labs  Lab 09/23/20 2227 09/24/20 0031 09/25/20 0705  CKTOTAL 6,882* 6,960* 4,791*   BNP (last 3 results) No results for input(s): PROBNP in the last 8760 hours. HbA1C: No results for input(s): HGBA1C in the last 72 hours. CBG: No results for input(s): GLUCAP in the last 168 hours. Lipid Profile: No results for input(s): CHOL, HDL, LDLCALC, TRIG, CHOLHDL, LDLDIRECT in the last 72 hours. Thyroid Function Tests: No results for input(s): TSH, T4TOTAL, FREET4, T3FREE, THYROIDAB in the last 72 hours. Anemia Panel: No results for input(s): VITAMINB12, FOLATE, FERRITIN, TIBC, IRON, RETICCTPCT in the last 72 hours. Sepsis Labs: Recent Labs  Lab 09/23/20 2227  PROCALCITON 0.43    Recent Results (from the past 240  hour(s))  SARS CORONAVIRUS 2 (TAT 6-24 HRS) Nasopharyngeal Nasopharyngeal Swab     Status: None   Collection Time: 09/23/20 10:00 PM   Specimen: Nasopharyngeal Swab  Result Value Ref Range Status   SARS Coronavirus 2 NEGATIVE NEGATIVE Final    Comment: (NOTE) SARS-CoV-2 target nucleic acids are NOT DETECTED.  The SARS-CoV-2 RNA is generally detectable in upper and lower respiratory specimens during the acute phase of infection. Negative results do not preclude SARS-CoV-2 infection, do not rule out co-infections with other pathogens, and should not be used as the sole basis for treatment or other patient management decisions.  Negative results must be combined with clinical observations, patient history, and epidemiological information. The expected result is Negative.  Fact Sheet for Patients: HairSlick.no  Fact Sheet for Healthcare Providers: quierodirigir.com  This test is not yet approved or cleared by the [place] FDA and  has been authorized for detection and/or diagnosis of SARS-CoV-2 by FDA under an Emergency Use Authorization (EUA). This EUA will remain  in effect (meaning this test can be used) for the duration of the COVID-19 declaration under Se ction 564(b)(1) of the Act, 21 U.S.C. section 360bbb-3(b)(1), unless the authorization is terminated or revoked sooner.  Performed at [facility], [address] [place], [place] [address]   Culture, blood (routine x 2)     Status: None (Preliminary result)   Collection Time: 09/23/20 10:27 PM   Specimen: BLOOD RIGHT HAND  Result Value Ref Range Status   Specimen Description BLOOD RIGHT HAND  Final   Special Requests   Final    BOTTLES DRAWN AEROBIC ONLY Blood Culture results may not be optimal due to an inadequate volume of blood received in culture bottles   Culture   Final    NO GROWTH 2 DAYS Performed at [facility], [address],  [place], [place] [address]    Report Status PENDING  Incomplete  Culture, blood (routine x 2)     Status: None (Preliminary result)   Collection Time: 09/23/20 10:27 PM   Specimen: BLOOD  Result Value Ref Range Status   Specimen Description BLOOD RIGHT ARM  Final   Special Requests   Final    BOTTLES DRAWN AEROBIC ONLY Blood Culture results may not be optimal due to an inadequate volume of blood received in culture bottles   Culture   Final    NO GROWTH 2 DAYS Performed at [facility], [address], [place], [place] [address]    Report Status PENDING  Incomplete  Culture, Urine     Status: None   Collection Time: 09/23/20 11:41 PM   Specimen: Urine, Random  Result Value Ref Range Status   Specimen Description URINE, RANDOM  Final   Special Requests NONE  Final   Culture   Final    NO GROWTH Performed at [facility], [address] [place], [place] [address]    Report Status 09/25/2020 FINAL  Final      Radiology Studies: CT ANGIO CHEST PE W OR WO CONTRAST  Addendum Date: 09/24/2020   ADDENDUM REPORT: 09/24/2020 21:54 ADDENDUM: Critical Value/emergent results were called by telephone at the time of interpretation on 09/24/2020 at 9:53 pm to provider Dr, Leafy Half , who verbally acknowledged these results. Electronically Signed   By: Donzetta Kohut M.D.   On: 09/24/2020 21:54   Result Date: 09/24/2020 CLINICAL DATA:  Suspected pulmonary embolism in a 87 year old male. EXAM: CT ANGIOGRAPHY CHEST WITH CONTRAST TECHNIQUE: Multidetector CT imaging of the chest was performed using the standard protocol during bolus administration of intravenous contrast. Multiplanar CT image reconstructions and MIPs were obtained to evaluate the vascular anatomy. CONTRAST:  75mL OMNIPAQUE IOHEXOL 350 MG/ML SOLN COMPARISON:  CT abdomen and pelvis of Sep 22, 2020 FINDINGS: Cardiovascular: Normal caliber thoracic aorta. Scattered atheromatous plaque. Top-normal heart size with small pericardial effusion.  Calcified atheromatous changes of coronary vasculature. Signs of pulmonary embolus in RIGHT upper lobe pulmonary artery image 135/7 segmental branch to RIGHT upper lobe. Tiny subsegmental embolus may be present in RIGHT middle lobe on image 252/6  no main pulmonary arterial thrombus RIGHT lower lobe embolus in segmental branch to posterior RIGHT lower lobe (image 235, series 7 RV to LV ratio mildly elevated at upper limits of normal at 0.91. Mediastinum/Nodes: Patulous esophagus. No axillary, mediastinal or hilar lymphadenopathy. Lungs/Pleura: Small effusions.  Basilar volume loss. Upper Abdomen: No acute upper abdominal findings. Musculoskeletal: Spinal degenerative changes with signs of ankylosing spondylitis and multilevel spinal fusion. Fracture of T7 vertebral body with horizontal orientation. Potential extension into the pedicles based on pattern and subtle lucency extending posteriorly. Posterior elements maintain normal relationships. Review of the MIP images confirms the above findings. IMPRESSION: 1. Signs of pulmonary arterial thrombus in RIGHT upper lobe pulmonary artery 135/7 segmental branch to RIGHT upper lobe. Tiny subsegmental embolus may be present in RIGHT middle lobe on /6/6 no main pulmonary arterial thrombus. RIGHT lower lobe segmental branch to posterior RIGHT lower lobe (image 235, series 7 (image 235, series 7 with evidence of right heart strain (RV to LV ratio mildly elevated at upper limits of normal at 0.91.) could consider echocardiographic correlation, findings are of uncertain significance given peripheral nature of small emboli. 2. T7 fracture with horizontal pattern may extend into LEFT pedicle and perhaps into RIGHT pedicle. Based on pattern of fracture there is high suspicion for instability at this level, also associated with multiple levels of fusion. Dedicated imaging of the thoracic spine or reconstructed bone algorithm of existing data may be helpful to better evaluate this  patient. 3. Small pericardial effusion. 4. Small effusions. 5. Aortic atherosclerosis. A call is out to the referring provider to further discuss findings in the above case. Electronically Signed: By: Donzetta KohutGeoffrey  Wile M.D. On: 09/24/2020 21:28   CT THORACIC SPINE WO CONTRAST  Result Date: 09/25/2020 CLINICAL DATA:  Thoracic fracture characterization EXAM: CT THORACIC SPINE WITHOUT CONTRAST TECHNIQUE: Multidetector CT images of the thoracic were obtained using the standard protocol without intravenous contrast. COMPARISON:  Chest CT and reformats from yesterday FINDINGS: Alignment: Negative for listhesis.  Exaggerated thoracic kyphosis. Vertebrae: Known horizontal fracture through the T7 vertebral body, roughly bisecting tt. A fracture is not seen fluoro the posterior cortex or extending to the posterior elements. Underlying rigid spine due to bridging osteophytes from T4-T12. Most of these levels are also fused by supraspinous ligament ossification. No evidence of bone lesion or erosion. Paraspinal and other soft tissues: Negative Disc levels: Spondylosis with bridging osteophytes. No visible cord impingement. IMPRESSION: Nondisplaced T7 vertebral body fracture. Although posterior element continuation is suspected given underlying rigid spine, no posterior element fractures are seen. Electronically Signed   By: Marnee SpringJonathon  Watts M.D.   On: 09/25/2020 06:58   CT T-SPINE NO CHARGE  Result Date: 09/25/2020 CLINICAL DATA:  Thoracic spine fracture EXAM: CT Thoracic Spine without contrast TECHNIQUE: Multiplanar CT images of the thoracic spine were reconstructed from contemporary CT of the Chest. CONTRAST:  No additional contrast administered COMPARISON:  None FINDINGS: Alignment: Normal Vertebrae: There is diffuse ankylosis of the thoracic spine. There is an acute, nondisplaced fracture of T7 that traverses the vertebral body from anterior to posterior. Visualization is poor due to decreased signal to noise ratio caused  by the contrast bolus in the superior vena cava. Assessment of the pedicles is limited. Paraspinal and other soft tissues: Negative. Disc levels: No spinal canal stenosis. IMPRESSION: 1. Acute, nondisplaced fracture of T7 with limited visualization due to decreased signal to noise ratio caused by the contrast bolus in the superior vena cava. Repeating the study without contrast would provide  the clearest answer as to whether there is extension to the pedicles. 2. Diffuse ankylosis of the thoracic spine. Electronically Signed   By: Deatra Robinson M.D.   On: 09/25/2020 00:20   ECHOCARDIOGRAM COMPLETE  Result Date: 09/24/2020    ECHOCARDIOGRAM REPORT   Patient Name:   JAYION SCHNECK Date of Exam: 09/24/2020 Medical Rec #:  [id number]           Height:       73.0 in Accession #:    [id number]          Weight:       231.3 lb Date of Birth:  11/10/1932           BSA:          2.289 m Patient Age:    88 years            BP:           149/90 mmHg Patient Gender: M                   HR:           96 bpm. Exam Location:  Inpatient Procedure: 2D Echo, Cardiac Doppler and Color Doppler Indications:    Syncope, elevated troponin  History:        Patient has no prior history of Echocardiogram examinations.  Sonographer:    Neomia Dear RDCS Referring Phys: 42 JARED M GARDNER  Sonographer Comments: Patient is morbidly obese. Image acquisition challenging due to patient body habitus. IMPRESSIONS  1. Left ventricular ejection fraction, by estimation, is 60 to 65%. The left ventricle has normal function. The left ventricle has no regional wall motion abnormalities. There is mild left ventricular hypertrophy. Left ventricular diastolic parameters are consistent with Grade I diastolic dysfunction (impaired relaxation).  2. Right ventricular systolic function is normal. The right ventricular size is mildly enlarged. There is normal pulmonary artery systolic pressure.  3. The mitral valve is normal in structure. No evidence of  mitral valve regurgitation. No evidence of mitral stenosis.  4. The aortic valve is calcified. There is mild calcification of the aortic valve. There is mild thickening of the aortic valve. Aortic valve regurgitation is not visualized. Mild to moderate aortic valve sclerosis/calcification is present, without any evidence of aortic stenosis. Aortic valve mean gradient measures 3.0 mmHg. Aortic valve Vmax measures 1.21 m/s.  5. There is borderline dilatation of the ascending aorta, measuring 38 mm.  6. The inferior vena cava is normal in size with greater than 50% respiratory variability, suggesting right atrial pressure of 3 mmHg. FINDINGS  Left Ventricle: Left ventricular ejection fraction, by estimation, is 60 to 65%. The left ventricle has normal function. The left ventricle has no regional wall motion abnormalities. The left ventricular internal cavity size was normal in size. There is  mild left ventricular hypertrophy. Left ventricular diastolic parameters are consistent with Grade I diastolic dysfunction (impaired relaxation). Right Ventricle: The right ventricular size is mildly enlarged. No increase in right ventricular wall thickness. Right ventricular systolic function is normal. There is normal pulmonary artery systolic pressure. The tricuspid regurgitant velocity is 2.28  m/s, and with an assumed right atrial pressure of 3 mmHg, the estimated right ventricular systolic pressure is 23.8 mmHg. Left Atrium: Left atrial size was normal in size. Right Atrium: Right atrial size was normal in size. Pericardium: There is no evidence of pericardial effusion. Mitral Valve: The mitral valve is normal in structure. No evidence of mitral  valve regurgitation. No evidence of mitral valve stenosis. Tricuspid Valve: The tricuspid valve is normal in structure. Tricuspid valve regurgitation is not demonstrated. No evidence of tricuspid stenosis. Aortic Valve: The aortic valve is calcified. There is mild calcification of  the aortic valve. There is mild thickening of the aortic valve. Aortic valve regurgitation is not visualized. Mild to moderate aortic valve sclerosis/calcification is present, without any evidence of aortic stenosis. Aortic valve mean gradient measures 3.0 mmHg. Aortic valve peak gradient measures 5.9 mmHg. Aortic valve area, by VTI measures 4.85 cm. Pulmonic Valve: The pulmonic valve was normal in structure. Pulmonic valve regurgitation is not visualized. No evidence of pulmonic stenosis. Aorta: The aortic root is normal in size and structure. There is borderline dilatation of the ascending aorta, measuring 38 mm. Venous: The inferior vena cava is normal in size with greater than 50% respiratory variability, suggesting right atrial pressure of 3 mmHg. IAS/Shunts: No atrial level shunt detected by color flow Doppler.  LEFT VENTRICLE PLAX 2D LVIDd:         2.80 cm LVIDs:         1.80 cm LV PW:         1.30 cm LV IVS:        1.00 cm LVOT diam:     2.60 cm LV SV:         101 LV SV Index:   44 LVOT Area:     5.31 cm  LV Volumes (MOD) LV vol d, MOD A2C: 38.1 ml LV vol d, MOD A4C: 64.6 ml LV vol s, MOD A2C: 22.2 ml LV vol s, MOD A4C: 17.5 ml LV SV MOD A2C:     15.9 ml LV SV MOD A4C:     64.6 ml LV SV MOD BP:      30.0 ml  PULMONARY VEINS A Reversal Duration: 88.00 msec A Reversal Velocity: 40.20 cm/s Diastolic Velocity:  33.60 cm/s S/D Velocity:        1.20 Systolic Velocity:   39.40 cm/s LEFT ATRIUM             Index       RIGHT ATRIUM           Index LA Vol (A2C):   27.7 ml 12.10 ml/m RA Area:     16.60 cm LA Vol (A4C):   45.5 ml 19.88 ml/m RA Volume:   37.90 ml  16.56 ml/m LA Biplane Vol: 37.9 ml 16.56 ml/m  AORTIC VALVE                   PULMONIC VALVE AV Area (Vmax):    4.48 cm    PV Vmax:       1.32 m/s AV Area (Vmean):   4.66 cm    PV Vmean:      92.200 cm/s AV Area (VTI):     4.85 cm    PV VTI:        0.274 m AV Vmax:           121.00 cm/s PV Peak grad:  6.9 mmHg AV Vmean:          84.100 cm/s PV Mean  grad:  4.0 mmHg AV VTI:            0.209 m AV Peak Grad:      5.9 mmHg AV Mean Grad:      3.0 mmHg LVOT Vmax:         102.00 cm/s LVOT Vmean:  73.800 cm/s LVOT VTI:          0.191 m LVOT/AV VTI ratio: 0.91  AORTA Ao Root diam: 3.90 cm Ao Asc diam:  3.80 cm MITRAL VALVE               TRICUSPID VALVE MV Area (PHT): 5.54 cm    TR Peak grad:   20.8 mmHg MV Decel Time: 137 msec    TR Vmax:        228.00 cm/s MV E velocity: 75.40 cm/s MV A velocity: 90.30 cm/s  SHUNTS MV E/A ratio:  0.83        Systemic VTI:  0.19 m                            Systemic Diam: 2.60 cm Donato Schultz MD Electronically signed by Donato Schultz MD Signature Date/Time: 09/24/2020/3:12:44 PM    Final    VAS [place] LOWER EXTREMITY VENOUS (DVT)  Result Date: 09/24/2020  Lower Venous DVT Study Patient Name:  CAISON HEARN  Date of Exam:   09/24/2020 Medical Rec #: [id number]            Accession #:    [id number] Date of Birth: [birth year]            Patient Gender: M Patient Age:   88Y Exam [facility]  [facility] Procedure:      VAS [place] LOWER EXTREMITY VENOUS (DVT) Referring Phys: [id number] [facility] MIKHAIL --------------------------------------------------------------------------------  Indications: Elevated ddimer.  Comparison Study: no prior Performing Technologist: Blanch Media RVS  Examination Guidelines: A complete evaluation includes B-mode imaging, spectral Doppler, color Doppler, and power Doppler as needed of all accessible portions of each vessel. Bilateral testing is considered an integral part of a complete examination. Limited examinations for reoccurring indications may be performed as noted. The reflux portion of the exam is performed with the patient in reverse Trendelenburg.  +---------+---------------+---------+-----------+----------+-------------------+ RIGHT    CompressibilityPhasicitySpontaneityPropertiesThrombus Aging      +---------+---------------+---------+-----------+----------+-------------------+ CFV       Full           Yes      Yes                                      +---------+---------------+---------+-----------+----------+-------------------+ SFJ      Full                                                             +---------+---------------+---------+-----------+----------+-------------------+ FV Prox  Full                                                             +---------+---------------+---------+-----------+----------+-------------------+ FV Mid   Full                                                             +---------+---------------+---------+-----------+----------+-------------------+  FV DistalFull                                                             +---------+---------------+---------+-----------+----------+-------------------+ PFV      Full                                                             +---------+---------------+---------+-----------+----------+-------------------+ POP      Full           Yes      Yes                                      +---------+---------------+---------+-----------+----------+-------------------+ PTV      Full                                                             +---------+---------------+---------+-----------+----------+-------------------+ PERO                                                  Not well visualized +---------+---------------+---------+-----------+----------+-------------------+   +---------+---------------+---------+-----------+----------+-------------------+ LEFT     CompressibilityPhasicitySpontaneityPropertiesThrombus Aging      +---------+---------------+---------+-----------+----------+-------------------+ CFV      Full           Yes      Yes                                      +---------+---------------+---------+-----------+----------+-------------------+ SFJ      Full                                                              +---------+---------------+---------+-----------+----------+-------------------+ FV Prox  Full                                                             +---------+---------------+---------+-----------+----------+-------------------+ FV Mid   Full                                                             +---------+---------------+---------+-----------+----------+-------------------+ FV DistalFull                                                             +---------+---------------+---------+-----------+----------+-------------------+  PFV      Full                                                             +---------+---------------+---------+-----------+----------+-------------------+ POP      Full           Yes      Yes                                      +---------+---------------+---------+-----------+----------+-------------------+ PTV      Full                                                             +---------+---------------+---------+-----------+----------+-------------------+ PERO                                                  Not well visualized +---------+---------------+---------+-----------+----------+-------------------+     Summary: BILATERAL: - No evidence of deep vein thrombosis seen in the lower extremities, bilaterally. - No evidence of superficial venous thrombosis in the lower extremities, bilaterally. -No evidence of popliteal cyst, bilaterally.   *See table(s) above for measurements and observations. Electronically signed by Gretta Began MD on 09/24/2020 at 8:17:05 PM.    Final       Scheduled Meds: Continuous Infusions: . heparin 1,900 Units/hr (09/25/20 0830)  . lactated ringers 150 mL/hr at 09/25/20 1022     LOS: 2 days      Time spent: 30 minutes   Noralee Stain, DO [facility] 09/25/2020, 12:59 PM   Available via Epic secure chat 7am-7pm After these hours, please refer to coverage provider listed on  [email or web address]

## 2020-09-25 NOTE — Plan of Care (Signed)
  Problem: Pain Managment: Goal: General experience of comfort will improve Outcome: Progressing   Problem: Safety: Goal: Ability to remain free from injury will improve Outcome: Progressing   

## 2020-09-25 NOTE — Progress Notes (Signed)
Physical Therapy Treatment Patient Details Name: Randy Mason MRN: [id number] DOB: 08/10/1932 Today's Date: 09/25/2020    History of Present Illness Pt is an 87 y.o. male admitted 09/23/20 after being found down outside; pt reports tripping on uneven grass and unable to get up, pt denies LOC. Workup for rhabdomyolysis, SIRS. Head CT negative. CXR negative. Pt found to have T7 fx and PE with further imaging 5/3. PMH includes fall with C1 compression fx and PE (2020), dementia.    PT Comments    Pt reports feeling better with OOB mobility and demonstrates capacity to ambulate for community distances with RW with no physical assistance. Pt requires VC for hand placement and forward trunk lean during sit>stand transfers. Pt denies exacerbation of symptoms with VSS on RA, limited by fatigue and pain. Pt education provided on back precautions and donning TLSO, brace often riding up due to body habitus. Pt will benefit from continued acute PT to address deficits in strength, endurance, activity tolerance, and improved safety. Dr. Noralee Stain provides clearance for mobility despite non-therapeutic INR from most recent lab values.   Follow Up Recommendations  Home health PT;Supervision/Assistance - 24 hour     Equipment Recommendations  None recommended by PT    Recommendations for Other Services       Precautions / Restrictions Precautions Precautions: Fall;Back Precaution Booklet Issued: Yes (comment) Required Braces or Orthoses: Spinal Brace Spinal Brace: Thoracolumbosacral orthotic Restrictions Weight Bearing Restrictions: No    Mobility  Bed Mobility Overal bed mobility: Needs Assistance Bed Mobility: Rolling;Sidelying to Sit Rolling: Min assist Sidelying to sit: Min assist       General bed mobility comments: Min A with trunk and hips to roll to side. Min A to swing LEs to EOB and VC to push through elbow to bring trunk upright.    Transfers Overall transfer level:  Needs assistance Equipment used: Rolling walker (2 wheeled) Transfers: Sit to/from Stand Sit to Stand: Min guard         General transfer comment: VC for hand placment and forward lean of trunk over COM.  Ambulation/Gait Ambulation/Gait assistance: Min guard Gait Distance (Feet): 125 Feet Assistive device: Rolling walker (2 wheeled) Gait Pattern/deviations: Step-through pattern;Decreased stride length;Trunk flexed Gait velocity: decreased Gait velocity interpretation: 1.31 - 2.62 ft/sec, indicative of limited community ambulator General Gait Details: VSS on RA, pt denies reports of lightheadedness/dizziness.   Stairs             Wheelchair Mobility    Modified Rankin (Stroke Patients Only)       Balance Overall balance assessment: Needs assistance Sitting-balance support: No upper extremity supported;Feet supported Sitting balance-Leahy Scale: Good     Standing balance support: Bilateral upper extremity supported;No upper extremity supported;During functional activity Standing balance-Leahy Scale: Fair Standing balance comment: Static standing with no UE support to don mask.                            Cognition Arousal/Alertness: Awake/alert Behavior During Therapy: WFL for tasks assessed/performed Overall Cognitive Status: History of cognitive impairments - at baseline Area of Impairment: Attention;Memory;Safety/judgement;Awareness;Problem solving                   Current Attention Level: Sustained Memory: Decreased short-term memory;Decreased recall of precautions   Safety/Judgement: Decreased awareness of safety;Decreased awareness of deficits Awareness: Intellectual Problem Solving: Requires verbal cues;Requires tactile cues General Comments: h/o dementia. Daughter reports increased confusion compared to baseline.  Exercises      General Comments General comments (skin integrity, edema, etc.): Pt reports feeling better with  sitting at EOB and ambulation.      Pertinent Vitals/Pain Pain Assessment: Faces Faces Pain Scale: Hurts little more Pain Location: abdomen, back Pain Descriptors / Indicators: Discomfort;Grimacing Pain Intervention(s): Monitored during session;Limited activity within patient's tolerance    Home Living                      Prior Function            PT Goals (current goals can now be found in the care plan section) Acute Rehab PT Goals Patient Stated Goal: Return home Progress towards PT goals: Progressing toward goals    Frequency    Min 3X/week      PT Plan Current plan remains appropriate    Co-evaluation              AM-PAC PT "6 Clicks" Mobility   Outcome Measure  Help needed turning from your back to your side while in a flat bed without using bedrails?: A Little Help needed moving from lying on your back to sitting on the side of a flat bed without using bedrails?: A Little Help needed moving to and from a bed to a chair (including a wheelchair)?: A Little Help needed standing up from a chair using your arms (e.g., wheelchair or bedside chair)?: A Little Help needed to walk in hospital room?: A Little Help needed climbing 3-5 steps with a railing? : A Little 6 Click Score: 18    End of Session Equipment Utilized During Treatment: Gait belt Activity Tolerance: Patient limited by fatigue;Patient limited by pain Patient left: in chair;with call bell/phone within reach;with chair alarm set;with family/visitor present Nurse Communication: Mobility status PT Visit Diagnosis: Other abnormalities of gait and mobility (R26.89);Muscle weakness (generalized) (M62.81);Pain Pain - Right/Left:  (back, abdomen) Pain - part of body:  (back and abdomen)     Time: 9622-2979 PT Time Calculation (min) (ACUTE ONLY): 37 min  Charges:  $Therapeutic Activity: 23-37 mins                     Acute Rehab  Pager: [phone]    Waldemar Dickens, SPT   09/25/2020, 5:46 PM

## 2020-09-25 NOTE — Discharge Instructions (Addendum)
Information on my medicine - ELIQUIS (apixaban)  Why was Eliquis prescribed for you? Eliquis was prescribed to treat blood clots that may have been found in the veins of your legs (deep vein thrombosis) or in your lungs (pulmonary embolism) and to reduce the risk of them occurring again.  What do You need to know about Eliquis ? The starting dose is 10 mg (two 5 mg tablets) taken TWICE daily for the FIRST SEVEN (7) DAYS, then on (enter date)  10/02/2020 the dose is reduced to ONE 5 mg tablet taken TWICE daily.  Eliquis may be taken with or without food.   Try to take the dose about the same time in the morning and in the evening. If you have difficulty swallowing the tablet whole please discuss with your pharmacist how to take the medication safely.  Take Eliquis exactly as prescribed and DO NOT stop taking Eliquis without talking to the doctor who prescribed the medication.  Stopping may increase your risk of developing a new blood clot.  Refill your prescription before you run out.  After discharge, you should have regular check-up appointments with your healthcare provider that is prescribing your Eliquis.    What do you do if you miss a dose? If a dose of ELIQUIS is not taken at the scheduled time, take it as soon as possible on the same day and twice-daily administration should be resumed. The dose should not be doubled to make up for a missed dose.  Important Safety Information A possible side effect of Eliquis is bleeding. You should call your healthcare provider right away if you experience any of the following: ? Bleeding from an injury or your nose that does not stop. ? Unusual colored urine (red or dark brown) or unusual colored stools (red or black). ? Unusual bruising for unknown reasons. ? A serious fall or if you hit your head (even if there is no bleeding).  Some medicines may interact with Eliquis and might increase your risk of bleeding or clotting while on  Eliquis. To help avoid this, consult your healthcare provider or pharmacist prior to using any new prescription or non-prescription medications, including herbals, vitamins, non-steroidal anti-inflammatory drugs (NSAIDs) and supplements.  This website has more information on Eliquis (apixaban): http://www.eliquis.com/eliquis/home

## 2020-09-26 ENCOUNTER — Inpatient Hospital Stay (HOSPITAL_COMMUNITY): Payer: Medicare Other

## 2020-09-26 ENCOUNTER — Other Ambulatory Visit (HOSPITAL_COMMUNITY): Payer: Self-pay

## 2020-09-26 MED ORDER — APIXABAN 5 MG PO TABS
5.0000 mg | ORAL_TABLET | Freq: Two times a day (BID) | ORAL | 0 refills | Status: AC
  Filled 2020-09-26: qty 60, 30d supply, fill #0

## 2020-09-26 MED ORDER — APIXABAN 5 MG PO TABS
10.0000 mg | ORAL_TABLET | Freq: Two times a day (BID) | ORAL | 0 refills | Status: AC
  Filled 2020-09-26: qty 72, 30d supply, fill #0

## 2020-09-26 NOTE — TOC Benefit Eligibility Note (Signed)
Patient Teacher, English as a foreign language completed.    The patient is currently admitted and upon discharge could be taking Eliquis 5 mg.  The current 30 day co-pay is, $142.00 due to a $95.00 deductible.  Once deductible is met co-pay will be $47.00.   The patient is insured through [place], [place] Patient Advocate Specialist [place] Team Direct Number: [phone]  Fax: [phone]

## 2020-09-26 NOTE — Progress Notes (Signed)
Removed tele and IV's, tolerated well. Awaiting daughter to transport home.

## 2020-09-26 NOTE — TOC Transition Note (Signed)
Transition of Care [facility]) - CM/SW Discharge Note   Patient Details  Name: Randy Mason MRN: [id number] Date of Birth: March 02, 1933  [facility] CM/SW Contact:  Bess Kinds, RN Phone Number: [phone] 09/26/2020, 1:15 PM   Clinical Narrative:     Patient to transition home today. Noted previous CM had arranged HH PT/OT with [facility]. HH orders provided by attending MD. Frances Furbish notified of discharge. No further TOC needs identified.   Final next level of care: Home w Home Health Services Barriers to Discharge: No Barriers Identified   Patient Goals and CMS Choice        Discharge Placement                       Discharge Plan and Services                DME Arranged: N/A DME Agency: NA       HH Arranged: PT,OT HH Agency: [facility] Date [facility] Agency Contacted: 09/26/20 Time HH Agency Contacted: [phone] Representative spoke with at [facility] Agency: Kandee Keen  Social Determinants of Health (SDOH) Interventions     Readmission Risk Interventions No flowsheet data found.

## 2020-09-26 NOTE — Progress Notes (Signed)
Physical Therapy Treatment Patient Details Name: Randy Mason MRN: [id number] DOB: 1933/02/01 Today's Date: 09/26/2020    History of Present Illness Pt is an 87 y.o. male admitted 09/23/20 after being found down outside; pt reports tripping on uneven grass and unable to get up, pt denies LOC. Workup for rhabdomyolysis, SIRS. Head CT negative. CXR negative. Pt found to have T7 fx and PE with further imaging 5/3. PMH includes fall with C1 compression fx and PE (2020), dementia.    PT Comments    Pt has difficulty recalling back precautions during session, reviewed precautions with demonstration. Pt demonstrates ability to ambulate for longer distances with RW, compared to last session. Pt experiences mild instability with stair negotiation, but is quick to recover. Pt requires some physical assistance for bed mobility to maintain back precautions, but no physical assistance for ambulation and management of stairs at this time. Pt will continue to benefit from acute PT to address limitations in overall strength, endurance, activity tolerance, and safety. Upon d/c, pt will require cueing to maintain back precautions and some physical assistance for bed mobility with rolling>sidelying>sit and to don brace in an effort to maintain back precautions.   Follow Up Recommendations  Home health PT;Supervision/Assistance - 24 hour     Equipment Recommendations  None recommended by PT    Recommendations for Other Services       Precautions / Restrictions Precautions Precautions: Fall;Back Precaution Comments: Pt reports he does not recall back precautions, reviewed/demonstrated back precautions with patient. Required Braces or Orthoses: Spinal Brace Spinal Brace: Thoracolumbosacral orthotic;Applied in sitting position Restrictions Weight Bearing Restrictions: No    Mobility  Bed Mobility Overal bed mobility: Needs Assistance Bed Mobility: Rolling;Sidelying to Sit Rolling: Min  assist Sidelying to sit: Min assist Supine to sit: Min assist;HOB elevated Sit to supine: Min assist Sit to sidelying: Min assist General bed mobility comments: min A to bring LEs over EOB and  trunk upright. Pt requires multiple VCs for hand placement and to push through elbow to come to sit.    Transfers Overall transfer level: Needs assistance Equipment used: Rolling walker (2 wheeled) Transfers: Sit to/from Stand Sit to Stand: Min guard         General transfer comment: VC for hand placement and upright posture.  Ambulation/Gait Ambulation/Gait assistance: Min guard Gait Distance (Feet): 500 Feet (500+) Assistive device: Rolling walker (2 wheeled) Gait Pattern/deviations: Step-through pattern;Decreased stride length;Trunk flexed Gait velocity: WNL Gait velocity interpretation: >2.62 ft/sec, indicative of community ambulatory General Gait Details: Pt encouraged to attempt gait without AD, pt apprehensive at this time.   Stairs Stairs: Yes Stairs assistance: Min guard Stair Management: Two rails;Step to pattern Number of Stairs: 4 General stair comments: Pt demonstrates mild instability with stair descent.   Wheelchair Mobility    Modified Rankin (Stroke Patients Only)       Balance Overall balance assessment: Needs assistance Sitting-balance support: No upper extremity supported;Feet supported Sitting balance-Leahy Scale: Good     Standing balance support: Bilateral upper extremity supported;No upper extremity supported;During functional activity Standing balance-Leahy Scale: Fair Standing balance comment: No UE support required to comb hair and don face mask during standing.                            Cognition Arousal/Alertness: Awake/alert Behavior During Therapy: WFL for tasks assessed/performed Overall Cognitive Status: History of cognitive impairments - at baseline Area of Impairment: Attention;Memory;Safety/judgement;Awareness;Problem  solving  Orientation Level: Disoriented to;Place;Situation Current Attention Level: Sustained Memory: Decreased short-term memory;Decreased recall of precautions Following Commands: Follows one step commands consistently Safety/Judgement: Decreased awareness of safety;Decreased awareness of deficits Awareness: Intellectual Problem Solving: Requires verbal cues;Requires tactile cues General Comments: h/o dementia      Exercises      General Comments General comments (skin integrity, edema, etc.): Pt forgetful of back precautions and doctor's order for donning brace when OOB.      Pertinent Vitals/Pain Pain Assessment: No/denies pain Faces Pain Scale: Hurts little more Pain Location: back Pain Descriptors / Indicators: Discomfort;Grimacing Pain Intervention(s): Monitored during session;Limited activity within patient's tolerance;Repositioned    Home Living                      Prior Function            PT Goals (current goals can now be found in the care plan section) Acute Rehab PT Goals Patient Stated Goal: Return home Progress towards PT goals: Progressing toward goals    Frequency    Min 3X/week      PT Plan Current plan remains appropriate    Co-evaluation              AM-PAC PT "6 Clicks" Mobility   Outcome Measure  Help needed turning from your back to your side while in a flat bed without using bedrails?: A Little Help needed moving from lying on your back to sitting on the side of a flat bed without using bedrails?: A Little Help needed moving to and from a bed to a chair (including a wheelchair)?: A Little Help needed standing up from a chair using your arms (e.g., wheelchair or bedside chair)?: A Little Help needed to walk in hospital room?: A Little Help needed climbing 3-5 steps with a railing? : A Little 6 Click Score: 18    End of Session Equipment Utilized During Treatment: Gait belt Activity Tolerance:  Patient tolerated treatment well Patient left: in chair;with call bell/phone within reach;with chair alarm set;with nursing/sitter in room Nurse Communication: Mobility status PT Visit Diagnosis: Other abnormalities of gait and mobility (R26.89);Muscle weakness (generalized) (M62.81);Unsteadiness on feet (R26.81)     Time: 1191-4782 PT Time Calculation (min) (ACUTE ONLY): 29 min  Charges:  $Gait Training: 8-22 mins $Therapeutic Activity: 8-22 mins                     Acute Rehab  Pager: [phone]    Waldemar Dickens, SPT 09/26/2020, 4:18 PM

## 2020-09-26 NOTE — Progress Notes (Signed)
Subjective: Patient reports neck pain without arm pain  Objective: Vital signs in last 24 hours: Temp:  [98.1 F (36.7 C)-99.5 F (37.5 C)] 98.6 F (37 C) (05/05 0510) Pulse Rate:  [78-95] 86 (05/05 0510) Resp:  [19-20] 20 (05/05 0510) BP: (129-153)/(69-77) 139/69 (05/05 0510) SpO2:  [93 %-96 %] 96 % (05/05 0510) Weight:  [106.1 kg] 106.1 kg (05/05 0510)  Intake/Output from previous day: 05/04 0701 - 05/05 0700 In: 5426.1 [P.O.:753; I.V.:4673.1] Out: 2000 [Urine:2000] Intake/Output this shift: No intake/output data recorded.  Neurologic: Grossly normal  Lab Results: Lab Results  Component Value Date   WBC 8.1 09/25/2020   HGB 12.6 (L) 09/25/2020   HCT 36.3 (L) 09/25/2020   MCV 95.8 09/25/2020   PLT 141 (L) 09/25/2020   Lab Results  Component Value Date   INR 1.12 01/10/2011   BMET Lab Results  Component Value Date   NA 136 09/25/2020   K 3.6 09/25/2020   CL 104 09/25/2020   CO2 27 09/25/2020   GLUCOSE 113 (H) 09/25/2020   BUN 8 09/25/2020   CREATININE 0.78 09/25/2020   CALCIUM 8.4 (L) 09/25/2020    Studies/Results: CT ANGIO CHEST PE W OR WO CONTRAST  Addendum Date: 09/24/2020   ADDENDUM REPORT: 09/24/2020 21:54 ADDENDUM: Critical Value/emergent results were called by telephone at the time of interpretation on 09/24/2020 at 9:53 pm to provider Dr, Leafy HalfShalhoub , who verbally acknowledged these results. Electronically Signed   By: Donzetta KohutGeoffrey  Wile M.D.   On: 09/24/2020 21:54   Result Date: 09/24/2020 CLINICAL DATA:  Suspected pulmonary embolism in a 87 year old male. EXAM: CT ANGIOGRAPHY CHEST WITH CONTRAST TECHNIQUE: Multidetector CT imaging of the chest was performed using the standard protocol during bolus administration of intravenous contrast. Multiplanar CT image reconstructions and MIPs were obtained to evaluate the vascular anatomy. CONTRAST:  75mL OMNIPAQUE IOHEXOL 350 MG/ML SOLN COMPARISON:  CT abdomen and pelvis of Sep 22, 2020 FINDINGS: Cardiovascular: Normal  caliber thoracic aorta. Scattered atheromatous plaque. Top-normal heart size with small pericardial effusion. Calcified atheromatous changes of coronary vasculature. Signs of pulmonary embolus in RIGHT upper lobe pulmonary artery image 135/7 segmental branch to RIGHT upper lobe. Tiny subsegmental embolus may be present in RIGHT middle lobe on image 252/6 no main pulmonary arterial thrombus RIGHT lower lobe embolus in segmental branch to posterior RIGHT lower lobe (image 235, series 7 RV to LV ratio mildly elevated at upper limits of normal at 0.91. Mediastinum/Nodes: Patulous esophagus. No axillary, mediastinal or hilar lymphadenopathy. Lungs/Pleura: Small effusions.  Basilar volume loss. Upper Abdomen: No acute upper abdominal findings. Musculoskeletal: Spinal degenerative changes with signs of ankylosing spondylitis and multilevel spinal fusion. Fracture of T7 vertebral body with horizontal orientation. Potential extension into the pedicles based on pattern and subtle lucency extending posteriorly. Posterior elements maintain normal relationships. Review of the MIP images confirms the above findings. IMPRESSION: 1. Signs of pulmonary arterial thrombus in RIGHT upper lobe pulmonary artery 135/7 segmental branch to RIGHT upper lobe. Tiny subsegmental embolus may be present in RIGHT middle lobe on /6/6 no main pulmonary arterial thrombus. RIGHT lower lobe segmental branch to posterior RIGHT lower lobe (image 235, series 7 (image 235, series 7 with evidence of right heart strain (RV to LV ratio mildly elevated at upper limits of normal at 0.91.) could consider echocardiographic correlation, findings are of uncertain significance given peripheral nature of small emboli. 2. T7 fracture with horizontal pattern may extend into LEFT pedicle and perhaps into RIGHT pedicle. Based on pattern of fracture there  is high suspicion for instability at this level, also associated with multiple levels of fusion. Dedicated imaging of  the thoracic spine or reconstructed bone algorithm of existing data may be helpful to better evaluate this patient. 3. Small pericardial effusion. 4. Small effusions. 5. Aortic atherosclerosis. A call is out to the referring provider to further discuss findings in the above case. Electronically Signed: By: Donzetta Kohut M.D. On: 09/24/2020 21:28   CT THORACIC SPINE WO CONTRAST  Result Date: 09/25/2020 CLINICAL DATA:  Thoracic fracture characterization EXAM: CT THORACIC SPINE WITHOUT CONTRAST TECHNIQUE: Multidetector CT images of the thoracic were obtained using the standard protocol without intravenous contrast. COMPARISON:  Chest CT and reformats from yesterday FINDINGS: Alignment: Negative for listhesis.  Exaggerated thoracic kyphosis. Vertebrae: Known horizontal fracture through the T7 vertebral body, roughly bisecting tt. A fracture is not seen fluoro the posterior cortex or extending to the posterior elements. Underlying rigid spine due to bridging osteophytes from T4-T12. Most of these levels are also fused by supraspinous ligament ossification. No evidence of bone lesion or erosion. Paraspinal and other soft tissues: Negative Disc levels: Spondylosis with bridging osteophytes. No visible cord impingement. IMPRESSION: Nondisplaced T7 vertebral body fracture. Although posterior element continuation is suspected given underlying rigid spine, no posterior element fractures are seen. Electronically Signed   By: Marnee Spring M.D.   On: 09/25/2020 06:58   CT T-SPINE NO CHARGE  Result Date: 09/25/2020 CLINICAL DATA:  Thoracic spine fracture EXAM: CT Thoracic Spine without contrast TECHNIQUE: Multiplanar CT images of the thoracic spine were reconstructed from contemporary CT of the Chest. CONTRAST:  No additional contrast administered COMPARISON:  None FINDINGS: Alignment: Normal Vertebrae: There is diffuse ankylosis of the thoracic spine. There is an acute, nondisplaced fracture of T7 that traverses the  vertebral body from anterior to posterior. Visualization is poor due to decreased signal to noise ratio caused by the contrast bolus in the superior vena cava. Assessment of the pedicles is limited. Paraspinal and other soft tissues: Negative. Disc levels: No spinal canal stenosis. IMPRESSION: 1. Acute, nondisplaced fracture of T7 with limited visualization due to decreased signal to noise ratio caused by the contrast bolus in the superior vena cava. Repeating the study without contrast would provide the clearest answer as to whether there is extension to the pedicles. 2. Diffuse ankylosis of the thoracic spine. Electronically Signed   By: Deatra Robinson M.D.   On: 09/25/2020 00:20   ECHOCARDIOGRAM COMPLETE  Result Date: 09/24/2020    ECHOCARDIOGRAM REPORT   Patient Name:   Randy Mason Date of Exam: 09/24/2020 Medical Rec #:  [id number]           Height:       73.0 in Accession #:    [id number]          Weight:       231.3 lb Date of Birth:  08-15-1932           BSA:          2.289 m Patient Age:    88 years            BP:           149/90 mmHg Patient Gender: M                   HR:           96 bpm. Exam Location:  Inpatient Procedure: 2D Echo, Cardiac Doppler and Color Doppler Indications:    Syncope,  elevated troponin  History:        Patient has no prior history of Echocardiogram examinations.  Sonographer:    Neomia Dear RDCS Referring Phys: 21 JARED M GARDNER  Sonographer Comments: Patient is morbidly obese. Image acquisition challenging due to patient body habitus. IMPRESSIONS  1. Left ventricular ejection fraction, by estimation, is 60 to 65%. The left ventricle has normal function. The left ventricle has no regional wall motion abnormalities. There is mild left ventricular hypertrophy. Left ventricular diastolic parameters are consistent with Grade I diastolic dysfunction (impaired relaxation).  2. Right ventricular systolic function is normal. The right ventricular size is mildly enlarged. There  is normal pulmonary artery systolic pressure.  3. The mitral valve is normal in structure. No evidence of mitral valve regurgitation. No evidence of mitral stenosis.  4. The aortic valve is calcified. There is mild calcification of the aortic valve. There is mild thickening of the aortic valve. Aortic valve regurgitation is not visualized. Mild to moderate aortic valve sclerosis/calcification is present, without any evidence of aortic stenosis. Aortic valve mean gradient measures 3.0 mmHg. Aortic valve Vmax measures 1.21 m/s.  5. There is borderline dilatation of the ascending aorta, measuring 38 mm.  6. The inferior vena cava is normal in size with greater than 50% respiratory variability, suggesting right atrial pressure of 3 mmHg. FINDINGS  Left Ventricle: Left ventricular ejection fraction, by estimation, is 60 to 65%. The left ventricle has normal function. The left ventricle has no regional wall motion abnormalities. The left ventricular internal cavity size was normal in size. There is  mild left ventricular hypertrophy. Left ventricular diastolic parameters are consistent with Grade I diastolic dysfunction (impaired relaxation). Right Ventricle: The right ventricular size is mildly enlarged. No increase in right ventricular wall thickness. Right ventricular systolic function is normal. There is normal pulmonary artery systolic pressure. The tricuspid regurgitant velocity is 2.28  m/s, and with an assumed right atrial pressure of 3 mmHg, the estimated right ventricular systolic pressure is 23.8 mmHg. Left Atrium: Left atrial size was normal in size. Right Atrium: Right atrial size was normal in size. Pericardium: There is no evidence of pericardial effusion. Mitral Valve: The mitral valve is normal in structure. No evidence of mitral valve regurgitation. No evidence of mitral valve stenosis. Tricuspid Valve: The tricuspid valve is normal in structure. Tricuspid valve regurgitation is not demonstrated. No  evidence of tricuspid stenosis. Aortic Valve: The aortic valve is calcified. There is mild calcification of the aortic valve. There is mild thickening of the aortic valve. Aortic valve regurgitation is not visualized. Mild to moderate aortic valve sclerosis/calcification is present, without any evidence of aortic stenosis. Aortic valve mean gradient measures 3.0 mmHg. Aortic valve peak gradient measures 5.9 mmHg. Aortic valve area, by VTI measures 4.85 cm. Pulmonic Valve: The pulmonic valve was normal in structure. Pulmonic valve regurgitation is not visualized. No evidence of pulmonic stenosis. Aorta: The aortic root is normal in size and structure. There is borderline dilatation of the ascending aorta, measuring 38 mm. Venous: The inferior vena cava is normal in size with greater than 50% respiratory variability, suggesting right atrial pressure of 3 mmHg. IAS/Shunts: No atrial level shunt detected by color flow Doppler.  LEFT VENTRICLE PLAX 2D LVIDd:         2.80 cm LVIDs:         1.80 cm LV PW:         1.30 cm LV IVS:        1.00  cm LVOT diam:     2.60 cm LV SV:         101 LV SV Index:   44 LVOT Area:     5.31 cm  LV Volumes (MOD) LV vol d, MOD A2C: 38.1 ml LV vol d, MOD A4C: 64.6 ml LV vol s, MOD A2C: 22.2 ml LV vol s, MOD A4C: 17.5 ml LV SV MOD A2C:     15.9 ml LV SV MOD A4C:     64.6 ml LV SV MOD BP:      30.0 ml  PULMONARY VEINS A Reversal Duration: 88.00 msec A Reversal Velocity: 40.20 cm/s Diastolic Velocity:  33.60 cm/s S/D Velocity:        1.20 Systolic Velocity:   39.40 cm/s LEFT ATRIUM             Index       RIGHT ATRIUM           Index LA Vol (A2C):   27.7 ml 12.10 ml/m RA Area:     16.60 cm LA Vol (A4C):   45.5 ml 19.88 ml/m RA Volume:   37.90 ml  16.56 ml/m LA Biplane Vol: 37.9 ml 16.56 ml/m  AORTIC VALVE                   PULMONIC VALVE AV Area (Vmax):    4.48 cm    PV Vmax:       1.32 m/s AV Area (Vmean):   4.66 cm    PV Vmean:      92.200 cm/s AV Area (VTI):     4.85 cm    PV VTI:         0.274 m AV Vmax:           121.00 cm/s PV Peak grad:  6.9 mmHg AV Vmean:          84.100 cm/s PV Mean grad:  4.0 mmHg AV VTI:            0.209 m AV Peak Grad:      5.9 mmHg AV Mean Grad:      3.0 mmHg LVOT Vmax:         102.00 cm/s LVOT Vmean:        73.800 cm/s LVOT VTI:          0.191 m LVOT/AV VTI ratio: 0.91  AORTA Ao Root diam: 3.90 cm Ao Asc diam:  3.80 cm MITRAL VALVE               TRICUSPID VALVE MV Area (PHT): 5.54 cm    TR Peak grad:   20.8 mmHg MV Decel Time: 137 msec    TR Vmax:        228.00 cm/s MV E velocity: 75.40 cm/s MV A velocity: 90.30 cm/s  SHUNTS MV E/A ratio:  0.83        Systemic VTI:  0.19 m                            Systemic Diam: 2.60 cm Donato Schultz MD Electronically signed by Donato Schultz MD Signature Date/Time: 09/24/2020/3:12:44 PM    Final    VAS [place] LOWER EXTREMITY VENOUS (DVT)  Result Date: 09/24/2020  Lower Venous DVT Study Patient Name:  Randy Mason  Date of Exam:   09/24/2020 Medical Rec #: [id number]            Accession #:  [id number] Date of Birth: [birth year]            Patient Gender: M Patient Age:   88Y Exam Location:  [facility] Procedure:      VAS [place] LOWER EXTREMITY VENOUS (DVT) Referring Phys: [id number] [facility] MIKHAIL --------------------------------------------------------------------------------  Indications: Elevated ddimer.  Comparison Study: no prior Performing Technologist: Blanch Media RVS  Examination Guidelines: A complete evaluation includes B-mode imaging, spectral Doppler, color Doppler, and power Doppler as needed of all accessible portions of each vessel. Bilateral testing is considered an integral part of a complete examination. Limited examinations for reoccurring indications may be performed as noted. The reflux portion of the exam is performed with the patient in reverse Trendelenburg.  +---------+---------------+---------+-----------+----------+-------------------+ RIGHT    CompressibilityPhasicitySpontaneityPropertiesThrombus  Aging      +---------+---------------+---------+-----------+----------+-------------------+ CFV      Full           Yes      Yes                                      +---------+---------------+---------+-----------+----------+-------------------+ SFJ      Full                                                             +---------+---------------+---------+-----------+----------+-------------------+ FV Prox  Full                                                             +---------+---------------+---------+-----------+----------+-------------------+ FV Mid   Full                                                             +---------+---------------+---------+-----------+----------+-------------------+ FV DistalFull                                                             +---------+---------------+---------+-----------+----------+-------------------+ PFV      Full                                                             +---------+---------------+---------+-----------+----------+-------------------+ POP      Full           Yes      Yes                                      +---------+---------------+---------+-----------+----------+-------------------+ PTV      Full                                                             +---------+---------------+---------+-----------+----------+-------------------+  PERO                                                  Not well visualized +---------+---------------+---------+-----------+----------+-------------------+   +---------+---------------+---------+-----------+----------+-------------------+ LEFT     CompressibilityPhasicitySpontaneityPropertiesThrombus Aging      +---------+---------------+---------+-----------+----------+-------------------+ CFV      Full           Yes      Yes                                       +---------+---------------+---------+-----------+----------+-------------------+ SFJ      Full                                                             +---------+---------------+---------+-----------+----------+-------------------+ FV Prox  Full                                                             +---------+---------------+---------+-----------+----------+-------------------+ FV Mid   Full                                                             +---------+---------------+---------+-----------+----------+-------------------+ FV DistalFull                                                             +---------+---------------+---------+-----------+----------+-------------------+ PFV      Full                                                             +---------+---------------+---------+-----------+----------+-------------------+ POP      Full           Yes      Yes                                      +---------+---------------+---------+-----------+----------+-------------------+ PTV      Full                                                             +---------+---------------+---------+-----------+----------+-------------------+ PERO  Not well visualized +---------+---------------+---------+-----------+----------+-------------------+     Summary: BILATERAL: - No evidence of deep vein thrombosis seen in the lower extremities, bilaterally. - No evidence of superficial venous thrombosis in the lower extremities, bilaterally. -No evidence of popliteal cyst, bilaterally.   *See table(s) above for measurements and observations. Electronically signed by Gretta Began MD on 09/24/2020 at 8:17:05 PM.    Final     Assessment/Plan: Neck pain - will evaluate  T7 fx - doing well in TLSO  Estimated body mass index is 30.86 kg/m as calculated from the following:   Height as of this encounter: 6\' 1"  (1.854  m).   Weight as of this encounter: 106.1 kg.    LOS: 3 days    09/26/2020, 8:23 AM

## 2020-09-26 NOTE — Discharge Summary (Signed)
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 Negative results do not preclude SARS-CoV-2 infection, do not rule out co-infections with other pathogens, and should not be used as the sole basis for treatment or other patient management decisions. Negative results must be combined with clinical observations, patient history, and epidemiological information. The expected result is Negative.  Fact Sheet for Patients: HairSlick.no  Fact Sheet for Healthcare Providers: quierodirigir.com  This test is not yet approved or cleared by the [place] FDA and  has been authorized for detection and/or diagnosis of SARS-CoV-2 by FDA under an Emergency Use Authorization (EUA). This EUA will remain  in effect (meaning this test can be used) for the duration of the COVID-19 declaration under Se ction 564(b)(1) of the Act, 21 U.S.C. section 360bbb-3(b)(1), unless the authorization is  terminated or revoked sooner.  Performed at [facility], [address] [place], [place] [address]   Culture, blood (routine x 2)     Status: None (Preliminary result)   Collection Time: 09/23/20 10:27 PM   Specimen: BLOOD RIGHT HAND  Result Value Ref Range Status   Specimen Description BLOOD RIGHT HAND  Final   Special Requests   Final    BOTTLES DRAWN AEROBIC ONLY Blood Culture results may not be optimal due to an inadequate volume of blood received in culture bottles   Culture   Final    NO GROWTH 3 DAYS Performed at [facility], [address], [place], [place] [address]    Report Status PENDING  Incomplete  Culture, blood (routine x 2)     Status: None (Preliminary result)   Collection Time: 09/23/20 10:27 PM   Specimen: BLOOD  Result Value Ref Range Status   Specimen Description BLOOD RIGHT ARM  Final   Special Requests   Final    BOTTLES DRAWN AEROBIC ONLY Blood Culture results may not be optimal due to an inadequate volume of blood received in culture bottles   Culture   Final    NO GROWTH 3 DAYS Performed at [facility], [address], [place], [place] [address]    Report Status PENDING  Incomplete  Culture, Urine     Status: None   Collection Time: 09/23/20 11:41 PM   Specimen: Urine, Random  Result Value Ref Range Status   Specimen Description URINE, RANDOM  Final   Special Requests NONE  Final   Culture   Final    NO GROWTH Performed at [facility], [address] [place], [place] [address]    Report Status 09/25/2020 FINAL  Final     Labs: BNP (last 3 results) No results for input(s): BNP in the last 8760 hours. Basic Metabolic Panel: Recent Labs  Lab 09/23/20 2227 09/24/20 0031 09/25/20 0705  NA 134* 134* 136  K 4.0 4.0 3.6  CL 104 104 104  CO2 22 23 27   GLUCOSE 117* 121* 113*  BUN 13 12 8   CREATININE 0.90 0.94 0.78  CALCIUM 8.6* 8.6* 8.4*   Liver Function Tests: Recent Labs  Lab 09/23/20 2227  AST 187*   ALT 49*  ALKPHOS 47  BILITOT 2.0*  PROT 6.3*  ALBUMIN 3.2*   No results for input(s): LIPASE, AMYLASE in the last 168 hours. No results for input(s): AMMONIA in the last 168 hours. CBC: Recent Labs  Lab 09/23/20 2227 09/24/20 0031 09/25/20 0705  WBC 11.3* 10.2 8.1  HGB 13.2 12.9* 12.6*  HCT 39.2 37.9* 36.3*  MCV 97.5 96.4 95.8  PLT 161 146* 141*   Cardiac Enzymes: Recent Labs  Lab 09/23/20 2227 09/24/20 0031 09/25/20 0705  CKTOTAL 6,882* 6,960* 4,791*   BNP: Invalid input(s): POCBNP CBG: No results for input(s): GLUCAP in the last 168 hours. D-Dimer Recent Labs    09/23/20 2227  DDIMER 10.40*   Hgb A1c No results for input(s): HGBA1C in the last 72 hours. Lipid Profile No results for input(s): CHOL, HDL, LDLCALC, TRIG, CHOLHDL, LDLDIRECT in the last 72 hours. Thyroid function studies No results for input(s): TSH, T4TOTAL, T3FREE, THYROIDAB in the last 72 hours.  Invalid input(s): FREET3 Anemia work up No results for input(s): VITAMINB12, FOLATE, FERRITIN, TIBC, IRON, RETICCTPCT in the last 72 hours. Urinalysis    Component Value Date/Time   COLORURINE YELLOW 09/23/2020 2341   APPEARANCEUR HAZY (A) 09/23/2020 2341   LABSPEC 1.019 09/23/2020 2341   PHURINE 5.0 09/23/2020 2341   GLUCOSEU NEGATIVE 09/23/2020 2341   HGBUR MODERATE (A) 09/23/2020 2341   BILIRUBINUR NEGATIVE 09/23/2020 2341   KETONESUR 5 (A) 09/23/2020 2341   PROTEINUR NEGATIVE 09/23/2020 2341   UROBILINOGEN 1.0 01/11/2011 0426   NITRITE NEGATIVE 09/23/2020 2341   LEUKOCYTESUR NEGATIVE 09/23/2020 2341   Sepsis Labs Invalid input(s): PROCALCITONIN,  WBC,  LACTICIDVEN Microbiology Recent Results (from the past 240 hour(s))  SARS CORONAVIRUS 2 (TAT 6-24 HRS) Nasopharyngeal Nasopharyngeal Swab     Status: None   Collection Time: 09/23/20 10:00 PM   Specimen: Nasopharyngeal Swab  Result Value Ref Range Status   SARS Coronavirus 2 NEGATIVE NEGATIVE Final    Comment: (NOTE) SARS-CoV-2  target nucleic acids are NOT DETECTED.  The SARS-CoV-2 RNA is generally detectable in upper and lower respiratory specimens during the acute phase of infection. Negative results do not preclude SARS-CoV-2 infection, do not rule out co-infections with other pathogens, and should not be used as the sole basis for treatment or other patient management decisions. Negative results must be combined with clinical observations, patient history, and epidemiological information. The expected result is Negative.  Fact Sheet for Patients: HairSlick.no  Fact Sheet for Healthcare Providers: quierodirigir.com  This test is not yet approved or cleared by the [place] FDA and  has been authorized for detection and/or diagnosis of SARS-CoV-2 by FDA under an Emergency Use Authorization (EUA). This EUA will remain  in effect (meaning this test can be used) for the duration of the COVID-19 declaration under Se ction 564(b)(1) of the Act, 21 U.S.C. section 360bbb-3(b)(1), unless the authorization is terminated or revoked sooner.  Performed at [facility], [address] [place], [place] [address]   Culture, blood (routine x 2)     Status: None (Preliminary result)   Collection Time: 09/23/20 10:27 PM   Specimen: BLOOD RIGHT HAND  Result Value Ref Range Status   Specimen Description BLOOD RIGHT HAND  Final   Special Requests   Final    BOTTLES DRAWN AEROBIC ONLY Blood Culture results may not be optimal due to an inadequate volume of blood received in culture bottles   Culture   Final    NO GROWTH 3 DAYS Performed at [facility], [address] [place], [place] [address]    Report Status PENDING  Incomplete  Culture, blood (routine x 2)     Status: None (Preliminary result)   Collection Time: 09/23/20 10:27 PM   Specimen: BLOOD  Result Value Ref Range Status   Specimen Description BLOOD RIGHT ARM  Final   Special Requests    Final    BOTTLES DRAWN AEROBIC ONLY Blood Culture results may not be optimal due to an inadequate volume of blood received in culture bottles   Culture   Final    NO GROWTH 3 DAYS Performed at [facility], [address] [place], [place] [address]    Report Status PENDING  Incomplete  Culture, Urine     Status: None   Collection Time: 09/23/20 11:41 PM   Specimen: Urine, Random  Result Value Ref Range Status   Specimen Description URINE, RANDOM  Final   Special Requests NONE  Final   Culture   Final    NO GROWTH Performed at [facility], [address], [place], [place] [address]    Report Status 09/25/2020 FINAL  Final     Patient was seen and examined on the day of discharge and was found to be in stable condition. Time coordinating discharge: 35 minutes including assessment and coordination of care, as well as examination of the patient.   SIGNED:  Noralee Stain, DO [facility] 09/26/2020, 1:09 PM

## 2020-09-26 NOTE — Progress Notes (Signed)
Occupational Therapy Treatment Patient Details Name: Randy Mason MRN: [id number] DOB: 1932-12-24 Today's Date: 09/26/2020    History of present illness Pt is an 87 y.o. male admitted 09/23/20 after being found down outside; pt reports tripping on uneven grass and unable to get up, pt denies LOC. Workup for rhabdomyolysis, SIRS. Head CT negative. CXR negative. Pt found to have T7 fx and PE with further imaging 5/3. PMH includes fall with C1 compression fx and PE (2020), dementia.   OT comments  Pt making progress with functional goals. Pt now with TLSO to be worn when OOB. Session focused on bed mobility, back precautions, ADL mobility with RW to sink, toilet transfers, clothing mgt and basic ADL mobility safety. Session ended with pharmacy coming in to see pt.   Follow Up Recommendations  Home health OT;Supervision/Assistance - 24 hour    Equipment Recommendations  None recommended by OT    Recommendations for Other Services      Precautions / Restrictions Precautions Precautions: Fall;Back Precaution Comments: pt unable to recall back precautions, reviwed all back precautions Required Braces or Orthoses: Spinal Brace Spinal Brace: Thoracolumbosacral orthotic Restrictions Weight Bearing Restrictions: No       Mobility Bed Mobility Overal bed mobility: Needs Assistance Bed Mobility: Rolling;Sidelying to Sit;Sit to Sidelying;Sit to Supine Rolling: Min assist Sidelying to sit: Min assist Supine to sit: Min assist;HOB elevated Sit to supine: Min assist Sit to sidelying: Min assist General bed mobility comments: min A with LEs to EOB and to elevate trunk, cues to use elbow to assist. Min A with LEs back onto bed    Transfers Overall transfer level: Needs assistance Equipment used: Rolling walker (2 wheeled) Transfers: Sit to/from Stand Sit to Stand: Min guard         General transfer comment: verbal cues for hand placement, cues for back precautions    Balance  Overall balance assessment: Needs assistance Sitting-balance support: No upper extremity supported;Feet supported Sitting balance-Leahy Scale: Good     Standing balance support: Bilateral upper extremity supported;During functional activity Standing balance-Leahy Scale: Fair                             ADL either performed or assessed with clinical judgement   ADL Overall ADL's : Needs assistance/impaired     Grooming: Wash/dry hands;Wash/dry face;Min guard;Cueing for safety;Standing           Upper Body Dressing : Set up;Supervision/safety;Sitting Upper Body Dressing Details (indicate cue type and reason): donned clean gown     Toilet Transfer: Min guard;Ambulation;RW;Cueing for safety;Cueing for sequencing   Toileting- Clothing Manipulation and Hygiene: Minimal assistance;Sit to/from stand       Functional mobility during ADLs: Min guard;Cueing for safety;Cueing for sequencing;Rolling walker       Vision Patient Visual Report: No change from baseline     Perception     Praxis      Cognition Arousal/Alertness: Awake/alert Behavior During Therapy: WFL for tasks assessed/performed Overall Cognitive Status: History of cognitive impairments - at baseline Area of Impairment: Attention;Memory;Safety/judgement;Awareness;Problem solving                 Orientation Level: Disoriented to;Place;Situation   Memory: Decreased short-term memory;Decreased recall of precautions Following Commands: Follows one step commands consistently Safety/Judgement: Decreased awareness of safety;Decreased awareness of deficits   Problem Solving: Requires verbal cues;Requires tactile cues General Comments: h/o dementia        Exercises  Shoulder Instructions       General Comments      Pertinent Vitals/ Pain       Pain Assessment: Faces Faces Pain Scale: Hurts little more Pain Location: back Pain Descriptors / Indicators: Discomfort;Grimacing Pain  Intervention(s): Monitored during session;Limited activity within patient's tolerance;Repositioned  Home Living                                          Prior Functioning/Environment              Frequency  Min 2X/week        Progress Toward Goals  OT Goals(current goals can now be found in the care plan section)  Progress towards OT goals: Progressing toward goals     Plan Discharge plan remains appropriate    Co-evaluation                 AM-PAC OT "6 Clicks" Daily Activity     Outcome Measure   Help from another person eating meals?: None Help from another person taking care of personal grooming?: A Little Help from another person toileting, which includes using toliet, bedpan, or urinal?: A Little Help from another person bathing (including washing, rinsing, drying)?: A Lot Help from another person to put on and taking off regular upper body clothing?: A Little Help from another person to put on and taking off regular lower body clothing?: A Lot 6 Click Score: 17    End of Session Equipment Utilized During Treatment: Gait belt;Rolling walker  OT Visit Diagnosis: Unsteadiness on feet (R26.81);Other abnormalities of gait and mobility (R26.89);Muscle weakness (generalized) (M62.81);Other symptoms and signs involving cognitive function;Pain Pain - part of body:  (back)   Activity Tolerance Patient tolerated treatment well   Patient Left in bed;with call bell/phone within reach;with bed alarm set   Nurse Communication          Time: 1346-1401 OT Time Calculation (min): 15 min  Charges: OT General Charges $OT Visit: 1 Visit OT Treatments $Self Care/Home Management : 8-22 mins     Galen Manila 09/26/2020, 3:05 PM

## 2020-09-26 NOTE — Plan of Care (Signed)

## 2020-09-27 ENCOUNTER — Other Ambulatory Visit (HOSPITAL_COMMUNITY): Payer: Self-pay

## 2020-09-27 ENCOUNTER — Telehealth (HOSPITAL_COMMUNITY): Payer: Self-pay | Admitting: Pharmacist

## 2020-09-27 NOTE — Telephone Encounter (Signed)
Pharmacy Transitions of Care Follow-up Telephone Call  Date of discharge: 09/26/2020 Discharge Diagnosis: PE  How have you been since you were released from the hospital? Patient states that he has been doing "good enough". I spoke mainly with patient's wife, Fraser Din. She is the main caregiver and helps patient with medication administration.   Medication changes made at discharge:  - START: Eliquis 10 mg BID for 7 days then 5 mg BID  - STOPPED: n/a- patient was not taking any prescription medications prior to admission.   - CHANGED: n/a  Medication changes verified by the patient? Yes    Medication Accessibility:  Home Pharmacy: [facility]- [phone]; [address] [place]  Was the patient provided with refills on discharged medications? Yes   Have all prescriptions been transferred from [facility] to home pharmacy? Transferred to [facility] in [place] 09/27/21; spoke with Threasa Beards, [facility]  Is the patient able to afford medications? Patient's wife states that she is concerned about the copay.    . Notable copays: $142 . Eligible patient assistance: Patient is on Medicare Part D and cannot use coupons. She states that once pt has met his deductible (~$200), the copay will be $47 which is manageable for the patient. I offered to coordinate with PCP to try and find a lower cost alternative, but pt's wife states that she doesn't want to change from Eliquis since pt did well using it in the past.    Patient's wife states that he has taken Xarelto in the past but he stopped taking it due to abnormal bleeding. He has taken Eliquis in the past and tolerated it well.  Normally sees Dr. Monico Blitz in [place]. He doesn't have a follow up appointment yet, but his daughter is supposed to make an appointment for next week.    Medication Review:   APIXABAN (ELIQUIS)  Apixaban 10 mg BID initiated on 09/25/20. Will switch to apixaban 5 mg BID after 7 days (DATE 10/02/20).  - Discussed importance of taking medication around  the same time everyday; Patient's wife states that she is going to give it at 9:30 AM and again at 9:30.  - No DDI; pt not taking any other medications - Advised patient of medications to avoid (NSAIDs, ASA)  - Educated that Tylenol (acetaminophen) will be the preferred analgesic to prevent risk of bleeding  - Emphasized importance of monitoring for signs and symptoms of bleeding (abnormal bruising, prolonged bleeding, nose bleeds, bleeding from gums, discolored urine, black tarry stools)  - Advised patient to alert all providers of anticoagulation therapy prior to starting a new medication or having a procedure    Follow-up Appointments:  PCP Hospital f/u appt confirmed? Patient's wife states that their daughter will be taking him to a follow up appointment with Dr. Manuella Ghazi. The daughter was supposed to make the appointment today.   If their condition worsens, is the pt aware to call PCP or go to the Emergency Dept.?yes  Final Patient Assessment: Mr. Verba seems to be doing well. He has not experienced any adverse events from Eliquis since his discharge yesterday. No other acute problems. Patient states he did not have any further questions regarding his medication.  Patient is concerned about the copay for Eliquis ($142 until deductible is met), but his wife states that they can afford it until the deductible is met. After deductible the copay will be $47. I transferred the remaining refill on the prescription to [facility] in [place]. Patient's wife expressed appreciation for the call and asked  that I call again on Monday to check in with them.   Zed Wanninger D. Donneta Romberg, PharmD, BCPS, [place]  [phone]

## 2020-09-27 NOTE — Telephone Encounter (Deleted)
Pharmacy Transitions of Care Follow-up Telephone Call  Date of discharge: 09/26/2020 Discharge Diagnosis: PE  How have you been since you were released from the hospital? Patient states that he has been doing "good enough". I spoke mainly with patient's wife, Randy Mason. She is the main caregiver and helps patient with medication administration.   Medication changes made at discharge:  - START: Eliquis 10 mg BID for 7 days then 5 mg BID  - STOPPED: n/a- patient was not taking any prescription medications prior to admission.   - CHANGED: n/a  Medication changes verified by the patient? Yes    Medication Accessibility:  [facility]acy[facility]- [phone]; [address] [place]  Was the patient provided with refills on discharged medications? Yes   Have all prescriptions been transferred from [facility] to home pharmacy? I will transfer them today.   Is the patient able to afford medications? Patient's wife states that she is concerned about the copay. He  . Notable copays: $142 . Eligible patient assistance: ***   Patient's wife states that he has taken Xarelto in the past. Normally sees Dr. Kirstie Peri in [place]. He doesn't have a follow up appointment yet, but his daughter is supposed to make an appointment for next week.    Medication Review:   APIXABAN (ELIQUIS)  Apixaban 10 mg BID initiated on 09/25/20. Will switch to apixaban 5 mg BID after 7 days (DATE 10/02/20).  - Discussed importance of taking medication around the same time everyday; Patient's wife states that she is going to give it at 9:30 AM and again at 9:30.  - No DDI; pt not taking any other medications - Advised patient of medications to avoid (NSAIDs, ASA)  - Educated that Tylenol (acetaminophen) will be the preferred analgesic to prevent risk of bleeding  - Emphasized importance of monitoring for signs and symptoms of bleeding (abnormal bruising, prolonged bleeding, nose bleeds, bleeding from gums, discolored urine, black tarry  stools)  - Advised patient to alert all providers of anticoagulation therapy prior to starting a new medication or having a procedure    Follow-up Appointments:  PCP Hospital f/u appt confirmed? *** Scheduled to see *** on *** @ ***.   Specialist Hospital f/u appt confirmed? *** Scheduled to see *** on *** @ ***.   If their condition worsens, is the pt aware to call PCP or go to the Emergency Dept.? ***  Final Patient Assessment: ***

## 2020-09-28 LAB — CULTURE, BLOOD (ROUTINE X 2)
Culture: NO GROWTH
Culture: NO GROWTH

## 2020-09-30 ENCOUNTER — Other Ambulatory Visit (HOSPITAL_COMMUNITY): Payer: Self-pay

## 2020-09-30 ENCOUNTER — Telehealth (HOSPITAL_COMMUNITY): Payer: Self-pay | Admitting: Pharmacist

## 2020-10-03 DIAGNOSIS — Z789 Other specified health status: Secondary | ICD-10-CM | POA: Diagnosis not present

## 2020-10-03 DIAGNOSIS — I2699 Other pulmonary embolism without acute cor pulmonale: Secondary | ICD-10-CM | POA: Diagnosis not present

## 2020-10-03 DIAGNOSIS — Z299 Encounter for prophylactic measures, unspecified: Secondary | ICD-10-CM | POA: Diagnosis not present

## 2020-10-03 DIAGNOSIS — S22069A Unspecified fracture of T7-T8 vertebra, initial encounter for closed fracture: Secondary | ICD-10-CM | POA: Diagnosis not present

## 2020-10-05 DIAGNOSIS — S22069D Unspecified fracture of T7-T8 vertebra, subsequent encounter for fracture with routine healing: Secondary | ICD-10-CM | POA: Diagnosis not present

## 2020-10-05 DIAGNOSIS — M47812 Spondylosis without myelopathy or radiculopathy, cervical region: Secondary | ICD-10-CM | POA: Diagnosis not present

## 2020-10-05 DIAGNOSIS — I2699 Other pulmonary embolism without acute cor pulmonale: Secondary | ICD-10-CM | POA: Diagnosis not present

## 2020-10-05 DIAGNOSIS — T796XXD Traumatic ischemia of muscle, subsequent encounter: Secondary | ICD-10-CM | POA: Diagnosis not present

## 2020-10-07 DIAGNOSIS — I2699 Other pulmonary embolism without acute cor pulmonale: Secondary | ICD-10-CM | POA: Diagnosis not present

## 2020-10-07 DIAGNOSIS — T796XXD Traumatic ischemia of muscle, subsequent encounter: Secondary | ICD-10-CM | POA: Diagnosis not present

## 2020-10-07 DIAGNOSIS — M47812 Spondylosis without myelopathy or radiculopathy, cervical region: Secondary | ICD-10-CM | POA: Diagnosis not present

## 2020-10-07 DIAGNOSIS — S22069D Unspecified fracture of T7-T8 vertebra, subsequent encounter for fracture with routine healing: Secondary | ICD-10-CM | POA: Diagnosis not present

## 2020-10-09 DIAGNOSIS — T796XXD Traumatic ischemia of muscle, subsequent encounter: Secondary | ICD-10-CM | POA: Diagnosis not present

## 2020-10-09 DIAGNOSIS — I2699 Other pulmonary embolism without acute cor pulmonale: Secondary | ICD-10-CM | POA: Diagnosis not present

## 2020-10-09 DIAGNOSIS — S22069D Unspecified fracture of T7-T8 vertebra, subsequent encounter for fracture with routine healing: Secondary | ICD-10-CM | POA: Diagnosis not present

## 2020-10-09 DIAGNOSIS — M47812 Spondylosis without myelopathy or radiculopathy, cervical region: Secondary | ICD-10-CM | POA: Diagnosis not present

## 2020-10-14 DIAGNOSIS — T796XXD Traumatic ischemia of muscle, subsequent encounter: Secondary | ICD-10-CM | POA: Diagnosis not present

## 2020-10-14 DIAGNOSIS — I2699 Other pulmonary embolism without acute cor pulmonale: Secondary | ICD-10-CM | POA: Diagnosis not present

## 2020-10-14 DIAGNOSIS — S22069D Unspecified fracture of T7-T8 vertebra, subsequent encounter for fracture with routine healing: Secondary | ICD-10-CM | POA: Diagnosis not present

## 2020-10-14 DIAGNOSIS — M47812 Spondylosis without myelopathy or radiculopathy, cervical region: Secondary | ICD-10-CM | POA: Diagnosis not present

## 2020-10-15 DIAGNOSIS — S22068A Other fracture of T7-T8 thoracic vertebra, initial encounter for closed fracture: Secondary | ICD-10-CM | POA: Diagnosis not present

## 2020-10-15 DIAGNOSIS — I2699 Other pulmonary embolism without acute cor pulmonale: Secondary | ICD-10-CM | POA: Diagnosis not present

## 2020-10-15 DIAGNOSIS — T796XXD Traumatic ischemia of muscle, subsequent encounter: Secondary | ICD-10-CM | POA: Diagnosis not present

## 2020-10-15 DIAGNOSIS — S22069D Unspecified fracture of T7-T8 vertebra, subsequent encounter for fracture with routine healing: Secondary | ICD-10-CM | POA: Diagnosis not present

## 2020-10-15 DIAGNOSIS — M47812 Spondylosis without myelopathy or radiculopathy, cervical region: Secondary | ICD-10-CM | POA: Diagnosis not present

## 2020-10-17 DIAGNOSIS — S22069D Unspecified fracture of T7-T8 vertebra, subsequent encounter for fracture with routine healing: Secondary | ICD-10-CM | POA: Diagnosis not present

## 2020-10-17 DIAGNOSIS — M47812 Spondylosis without myelopathy or radiculopathy, cervical region: Secondary | ICD-10-CM | POA: Diagnosis not present

## 2020-10-17 DIAGNOSIS — I2699 Other pulmonary embolism without acute cor pulmonale: Secondary | ICD-10-CM | POA: Diagnosis not present

## 2020-10-17 DIAGNOSIS — T796XXD Traumatic ischemia of muscle, subsequent encounter: Secondary | ICD-10-CM | POA: Diagnosis not present

## 2020-10-18 DIAGNOSIS — S22069D Unspecified fracture of T7-T8 vertebra, subsequent encounter for fracture with routine healing: Secondary | ICD-10-CM | POA: Diagnosis not present

## 2020-10-18 DIAGNOSIS — I2699 Other pulmonary embolism without acute cor pulmonale: Secondary | ICD-10-CM | POA: Diagnosis not present

## 2020-10-18 DIAGNOSIS — I119 Hypertensive heart disease without heart failure: Secondary | ICD-10-CM | POA: Diagnosis not present

## 2020-10-23 DIAGNOSIS — M47812 Spondylosis without myelopathy or radiculopathy, cervical region: Secondary | ICD-10-CM | POA: Diagnosis not present

## 2020-10-23 DIAGNOSIS — I2699 Other pulmonary embolism without acute cor pulmonale: Secondary | ICD-10-CM | POA: Diagnosis not present

## 2020-10-23 DIAGNOSIS — S22069D Unspecified fracture of T7-T8 vertebra, subsequent encounter for fracture with routine healing: Secondary | ICD-10-CM | POA: Diagnosis not present

## 2020-10-23 DIAGNOSIS — T796XXD Traumatic ischemia of muscle, subsequent encounter: Secondary | ICD-10-CM | POA: Diagnosis not present

## 2020-10-24 DIAGNOSIS — M47812 Spondylosis without myelopathy or radiculopathy, cervical region: Secondary | ICD-10-CM | POA: Diagnosis not present

## 2020-10-24 DIAGNOSIS — I2699 Other pulmonary embolism without acute cor pulmonale: Secondary | ICD-10-CM | POA: Diagnosis not present

## 2020-10-24 DIAGNOSIS — S22069D Unspecified fracture of T7-T8 vertebra, subsequent encounter for fracture with routine healing: Secondary | ICD-10-CM | POA: Diagnosis not present

## 2020-10-24 DIAGNOSIS — T796XXD Traumatic ischemia of muscle, subsequent encounter: Secondary | ICD-10-CM | POA: Diagnosis not present

## 2020-10-25 DIAGNOSIS — T796XXD Traumatic ischemia of muscle, subsequent encounter: Secondary | ICD-10-CM | POA: Diagnosis not present

## 2020-10-25 DIAGNOSIS — M47812 Spondylosis without myelopathy or radiculopathy, cervical region: Secondary | ICD-10-CM | POA: Diagnosis not present

## 2020-10-25 DIAGNOSIS — S22069D Unspecified fracture of T7-T8 vertebra, subsequent encounter for fracture with routine healing: Secondary | ICD-10-CM | POA: Diagnosis not present

## 2020-10-25 DIAGNOSIS — I2699 Other pulmonary embolism without acute cor pulmonale: Secondary | ICD-10-CM | POA: Diagnosis not present

## 2020-10-28 DIAGNOSIS — S22069D Unspecified fracture of T7-T8 vertebra, subsequent encounter for fracture with routine healing: Secondary | ICD-10-CM | POA: Diagnosis not present

## 2020-10-28 DIAGNOSIS — M47812 Spondylosis without myelopathy or radiculopathy, cervical region: Secondary | ICD-10-CM | POA: Diagnosis not present

## 2020-10-28 DIAGNOSIS — T796XXD Traumatic ischemia of muscle, subsequent encounter: Secondary | ICD-10-CM | POA: Diagnosis not present

## 2020-10-28 DIAGNOSIS — I2699 Other pulmonary embolism without acute cor pulmonale: Secondary | ICD-10-CM | POA: Diagnosis not present

## 2020-11-04 DIAGNOSIS — T796XXD Traumatic ischemia of muscle, subsequent encounter: Secondary | ICD-10-CM | POA: Diagnosis not present

## 2020-11-04 DIAGNOSIS — M47812 Spondylosis without myelopathy or radiculopathy, cervical region: Secondary | ICD-10-CM | POA: Diagnosis not present

## 2020-11-04 DIAGNOSIS — S22069D Unspecified fracture of T7-T8 vertebra, subsequent encounter for fracture with routine healing: Secondary | ICD-10-CM | POA: Diagnosis not present

## 2020-11-04 DIAGNOSIS — I2699 Other pulmonary embolism without acute cor pulmonale: Secondary | ICD-10-CM | POA: Diagnosis not present

## 2020-11-26 DIAGNOSIS — S22068A Other fracture of T7-T8 thoracic vertebra, initial encounter for closed fracture: Secondary | ICD-10-CM | POA: Diagnosis not present

## 2020-12-26 DIAGNOSIS — Z789 Other specified health status: Secondary | ICD-10-CM | POA: Diagnosis not present

## 2020-12-26 DIAGNOSIS — G309 Alzheimer's disease, unspecified: Secondary | ICD-10-CM | POA: Diagnosis not present

## 2020-12-26 DIAGNOSIS — Z299 Encounter for prophylactic measures, unspecified: Secondary | ICD-10-CM | POA: Diagnosis not present

## 2020-12-26 DIAGNOSIS — D692 Other nonthrombocytopenic purpura: Secondary | ICD-10-CM | POA: Diagnosis not present

## 2020-12-27 DIAGNOSIS — S22068A Other fracture of T7-T8 thoracic vertebra, initial encounter for closed fracture: Secondary | ICD-10-CM | POA: Diagnosis not present

## 2020-12-27 DIAGNOSIS — M4324 Fusion of spine, thoracic region: Secondary | ICD-10-CM | POA: Diagnosis not present

## 2020-12-27 DIAGNOSIS — S22069A Unspecified fracture of T7-T8 vertebra, initial encounter for closed fracture: Secondary | ICD-10-CM | POA: Diagnosis not present

## 2020-12-31 DIAGNOSIS — I10 Essential (primary) hypertension: Secondary | ICD-10-CM | POA: Diagnosis not present

## 2020-12-31 DIAGNOSIS — S22068A Other fracture of T7-T8 thoracic vertebra, initial encounter for closed fracture: Secondary | ICD-10-CM | POA: Diagnosis not present

## 2021-01-28 DIAGNOSIS — S22068A Other fracture of T7-T8 thoracic vertebra, initial encounter for closed fracture: Secondary | ICD-10-CM | POA: Diagnosis not present

## 2021-03-31 DIAGNOSIS — S22059A Unspecified fracture of T5-T6 vertebra, initial encounter for closed fracture: Secondary | ICD-10-CM | POA: Diagnosis not present

## 2021-03-31 DIAGNOSIS — S22069A Unspecified fracture of T7-T8 vertebra, initial encounter for closed fracture: Secondary | ICD-10-CM | POA: Diagnosis not present

## 2021-03-31 DIAGNOSIS — M40204 Unspecified kyphosis, thoracic region: Secondary | ICD-10-CM | POA: Diagnosis not present

## 2021-03-31 DIAGNOSIS — S22068A Other fracture of T7-T8 thoracic vertebra, initial encounter for closed fracture: Secondary | ICD-10-CM | POA: Diagnosis not present

## 2021-03-31 DIAGNOSIS — M2578 Osteophyte, vertebrae: Secondary | ICD-10-CM | POA: Diagnosis not present

## 2021-04-03 DIAGNOSIS — S22068A Other fracture of T7-T8 thoracic vertebra, initial encounter for closed fracture: Secondary | ICD-10-CM | POA: Diagnosis not present

## 2021-04-03 DIAGNOSIS — R030 Elevated blood-pressure reading, without diagnosis of hypertension: Secondary | ICD-10-CM | POA: Diagnosis not present

## 2021-04-28 DIAGNOSIS — Z299 Encounter for prophylactic measures, unspecified: Secondary | ICD-10-CM | POA: Diagnosis not present

## 2021-04-28 DIAGNOSIS — Z713 Dietary counseling and surveillance: Secondary | ICD-10-CM | POA: Diagnosis not present

## 2021-04-28 DIAGNOSIS — G309 Alzheimer's disease, unspecified: Secondary | ICD-10-CM | POA: Diagnosis not present

## 2021-04-28 DIAGNOSIS — Z2821 Immunization not carried out because of patient refusal: Secondary | ICD-10-CM | POA: Diagnosis not present

## 2021-05-01 DIAGNOSIS — H5051 Esophoria: Secondary | ICD-10-CM | POA: Diagnosis not present

## 2021-05-01 DIAGNOSIS — H43393 Other vitreous opacities, bilateral: Secondary | ICD-10-CM | POA: Diagnosis not present

## 2021-05-01 DIAGNOSIS — H2513 Age-related nuclear cataract, bilateral: Secondary | ICD-10-CM | POA: Diagnosis not present

## 2021-05-01 DIAGNOSIS — H0102B Squamous blepharitis left eye, upper and lower eyelids: Secondary | ICD-10-CM | POA: Diagnosis not present

## 2021-05-01 DIAGNOSIS — H532 Diplopia: Secondary | ICD-10-CM | POA: Diagnosis not present

## 2021-05-01 DIAGNOSIS — H04123 Dry eye syndrome of bilateral lacrimal glands: Secondary | ICD-10-CM | POA: Diagnosis not present

## 2021-05-01 DIAGNOSIS — H0102A Squamous blepharitis right eye, upper and lower eyelids: Secondary | ICD-10-CM | POA: Diagnosis not present

## 2021-07-01 DIAGNOSIS — Z7189 Other specified counseling: Secondary | ICD-10-CM | POA: Diagnosis not present

## 2021-07-01 DIAGNOSIS — G252 Other specified forms of tremor: Secondary | ICD-10-CM | POA: Diagnosis not present

## 2021-07-01 DIAGNOSIS — G309 Alzheimer's disease, unspecified: Secondary | ICD-10-CM | POA: Diagnosis not present

## 2021-07-01 DIAGNOSIS — I2699 Other pulmonary embolism without acute cor pulmonale: Secondary | ICD-10-CM | POA: Diagnosis not present

## 2021-07-01 DIAGNOSIS — Z789 Other specified health status: Secondary | ICD-10-CM | POA: Diagnosis not present

## 2021-07-01 DIAGNOSIS — Z0000 Encounter for general adult medical examination without abnormal findings: Secondary | ICD-10-CM | POA: Diagnosis not present

## 2021-07-01 DIAGNOSIS — Z299 Encounter for prophylactic measures, unspecified: Secondary | ICD-10-CM | POA: Diagnosis not present

## 2021-08-21 DIAGNOSIS — Z789 Other specified health status: Secondary | ICD-10-CM | POA: Diagnosis not present

## 2021-08-21 DIAGNOSIS — G300 Alzheimer's disease with early onset: Secondary | ICD-10-CM | POA: Diagnosis not present

## 2021-08-21 DIAGNOSIS — R079 Chest pain, unspecified: Secondary | ICD-10-CM | POA: Diagnosis not present

## 2021-08-21 DIAGNOSIS — Z299 Encounter for prophylactic measures, unspecified: Secondary | ICD-10-CM | POA: Diagnosis not present

## 2021-08-21 DIAGNOSIS — J029 Acute pharyngitis, unspecified: Secondary | ICD-10-CM | POA: Diagnosis not present

## 2021-09-29 DIAGNOSIS — G309 Alzheimer's disease, unspecified: Secondary | ICD-10-CM | POA: Diagnosis not present

## 2021-09-29 DIAGNOSIS — Z299 Encounter for prophylactic measures, unspecified: Secondary | ICD-10-CM | POA: Diagnosis not present

## 2021-09-29 DIAGNOSIS — J069 Acute upper respiratory infection, unspecified: Secondary | ICD-10-CM | POA: Diagnosis not present

## 2021-09-29 DIAGNOSIS — Z789 Other specified health status: Secondary | ICD-10-CM | POA: Diagnosis not present

## 2021-10-22 DIAGNOSIS — G252 Other specified forms of tremor: Secondary | ICD-10-CM | POA: Diagnosis not present

## 2022-01-05 DIAGNOSIS — G309 Alzheimer's disease, unspecified: Secondary | ICD-10-CM | POA: Diagnosis not present

## 2022-01-05 DIAGNOSIS — S12000A Unspecified displaced fracture of first cervical vertebra, initial encounter for closed fracture: Secondary | ICD-10-CM | POA: Diagnosis not present

## 2022-01-05 DIAGNOSIS — Z299 Encounter for prophylactic measures, unspecified: Secondary | ICD-10-CM | POA: Diagnosis not present

## 2022-01-05 DIAGNOSIS — S22069A Unspecified fracture of T7-T8 vertebra, initial encounter for closed fracture: Secondary | ICD-10-CM | POA: Diagnosis not present

## 2022-02-03 DIAGNOSIS — Z23 Encounter for immunization: Secondary | ICD-10-CM | POA: Diagnosis not present

## 2022-03-12 DIAGNOSIS — Z0000 Encounter for general adult medical examination without abnormal findings: Secondary | ICD-10-CM | POA: Diagnosis not present

## 2022-03-12 DIAGNOSIS — G309 Alzheimer's disease, unspecified: Secondary | ICD-10-CM | POA: Diagnosis not present

## 2022-03-12 DIAGNOSIS — Z299 Encounter for prophylactic measures, unspecified: Secondary | ICD-10-CM | POA: Diagnosis not present

## 2022-03-12 DIAGNOSIS — M549 Dorsalgia, unspecified: Secondary | ICD-10-CM | POA: Diagnosis not present

## 2022-03-12 DIAGNOSIS — I2699 Other pulmonary embolism without acute cor pulmonale: Secondary | ICD-10-CM | POA: Diagnosis not present

## 2022-03-23 DIAGNOSIS — M545 Low back pain, unspecified: Secondary | ICD-10-CM | POA: Diagnosis not present

## 2022-03-27 IMAGING — CT CT CERVICAL SPINE W/O CM
3 of 4 series · 11 of 33 positions shown, 13 images · non-contrast
Comparison: None.

CLINICAL DATA: Neck pain, thoracic spine fracture

EXAM:
CT CERVICAL SPINE WITHOUT CONTRAST
TECHNIQUE: Multidetector CT imaging of the cervical spine was performed without
intravenous contrast. Multiplanar CT image reconstructions were also
generated.

[Series 5: c_spine 2.0 st · axial · 0.34mm/px · z∈[-261,-131]mm · 3 of 99 slices shown, 4 images]
[im 17/99  soft-tissue]
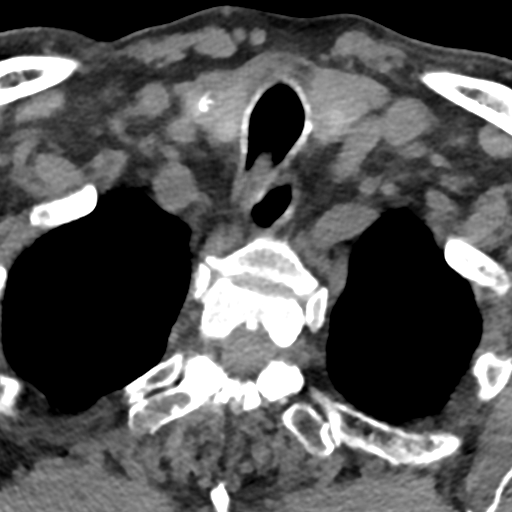
[im 17/99  bone]
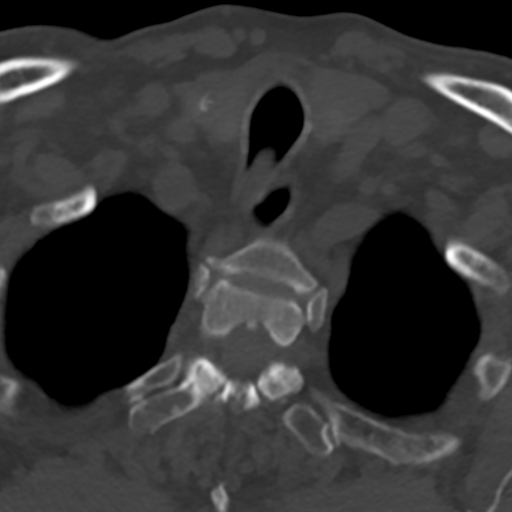
[im 50/99  bone]
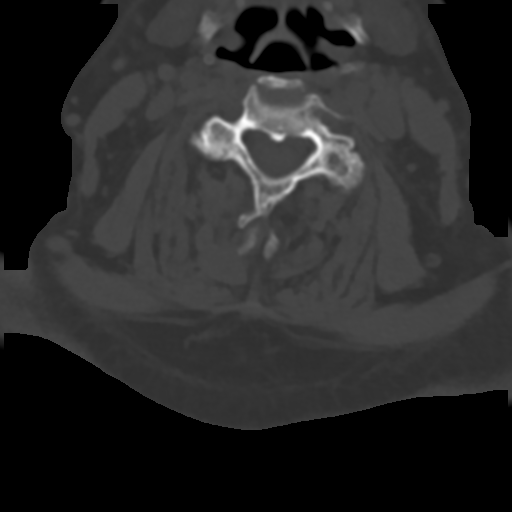
[im 82/99  bone]
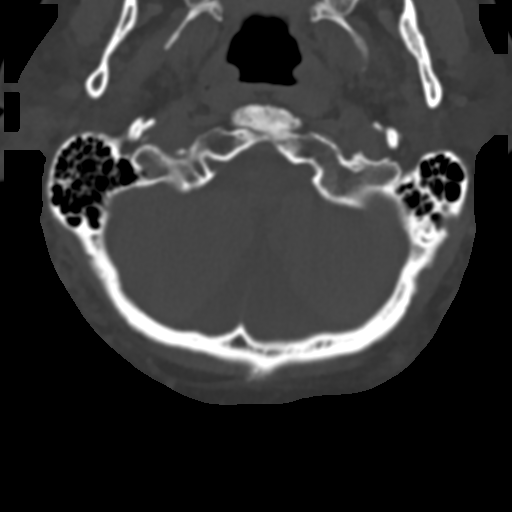

[Series 8: coronal bone · coronal · 0.29mm/px · 3 of 61 slices shown]
[im 15/61  bone]
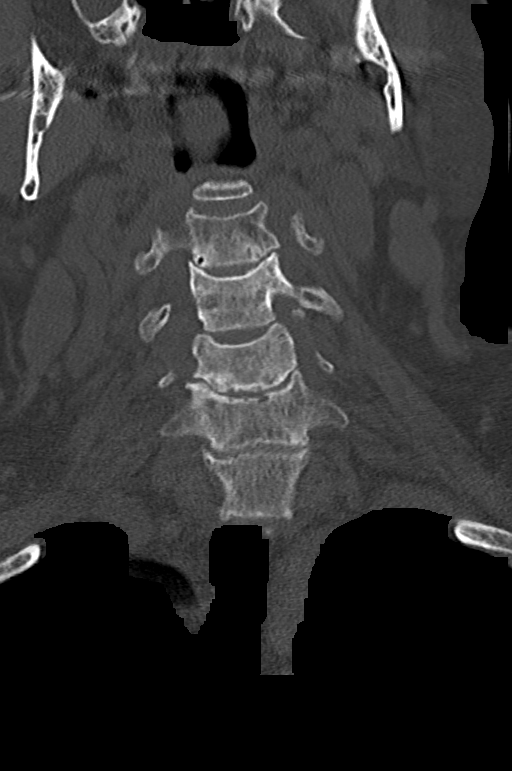
[im 25/61  bone]
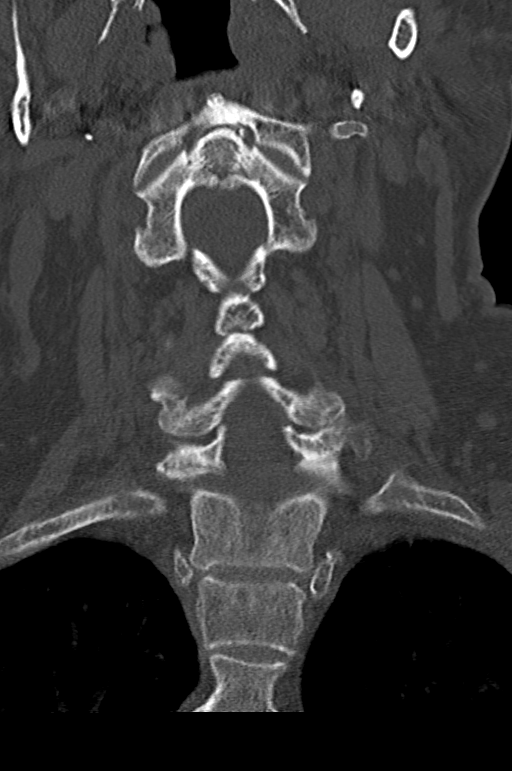
[im 36/61  bone]
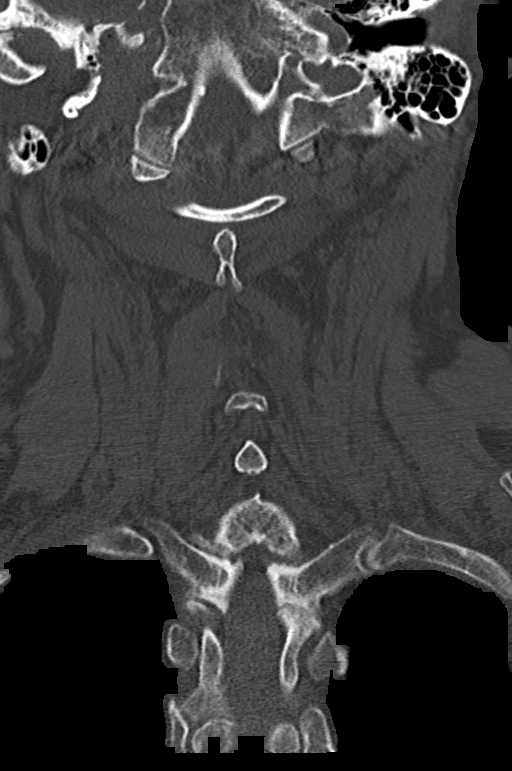

[Series 9: sagittal bone · sagittal · 0.23mm/px · 5 of 75 slices shown, 6 images]
[im 25/75  bone]
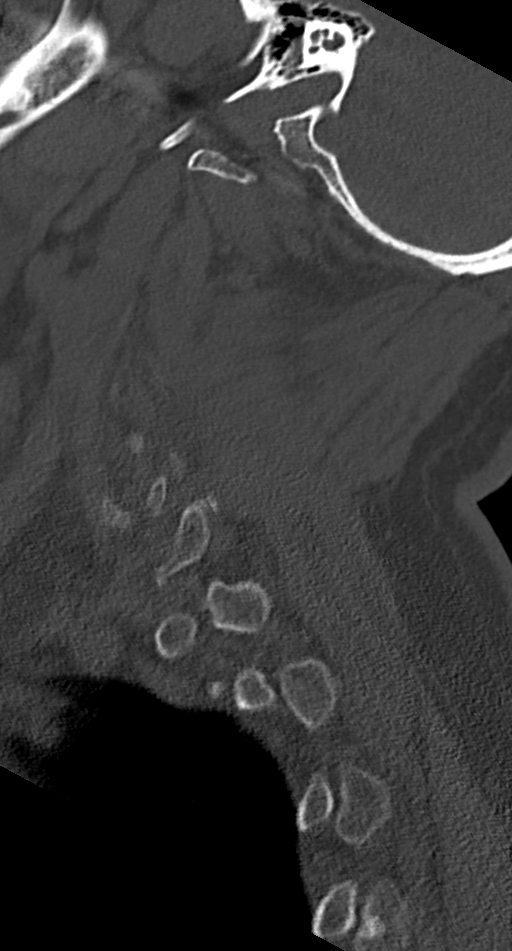
[im 31/75  bone]
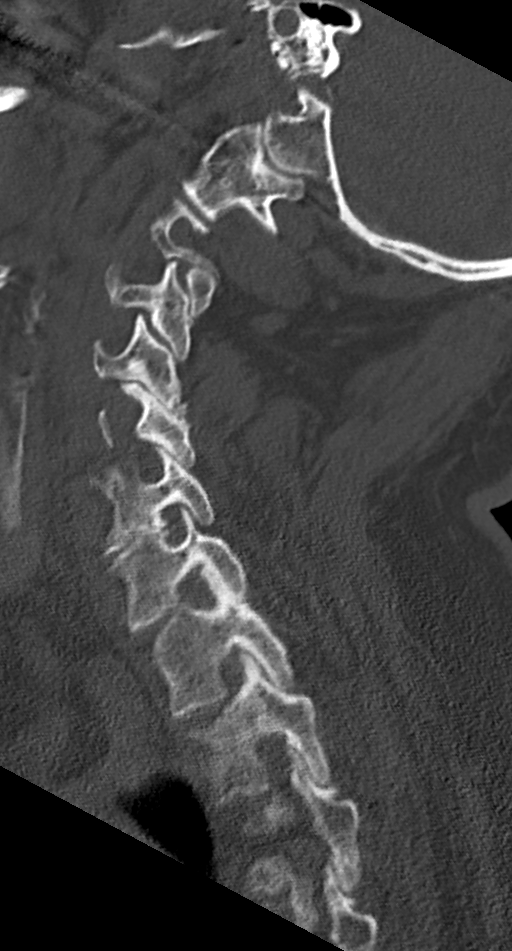
[im 38/75  soft-tissue]
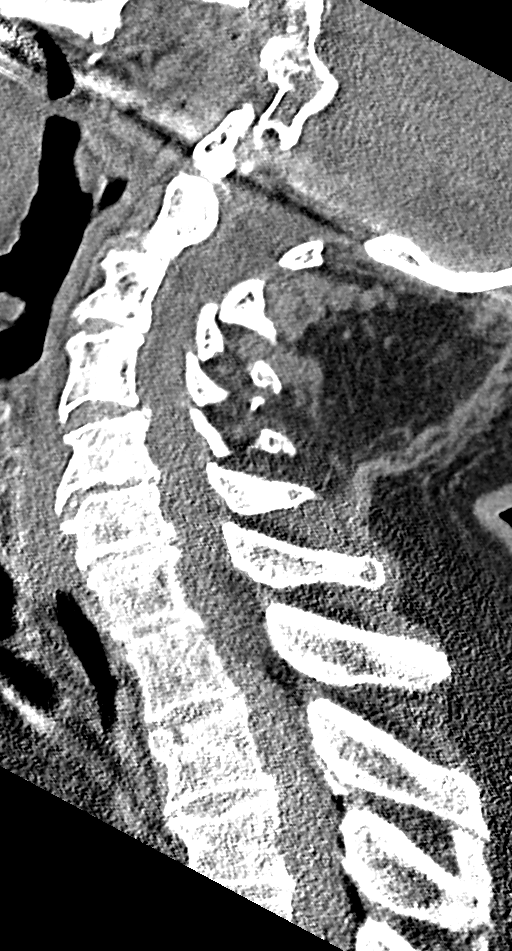
[im 38/75  bone]
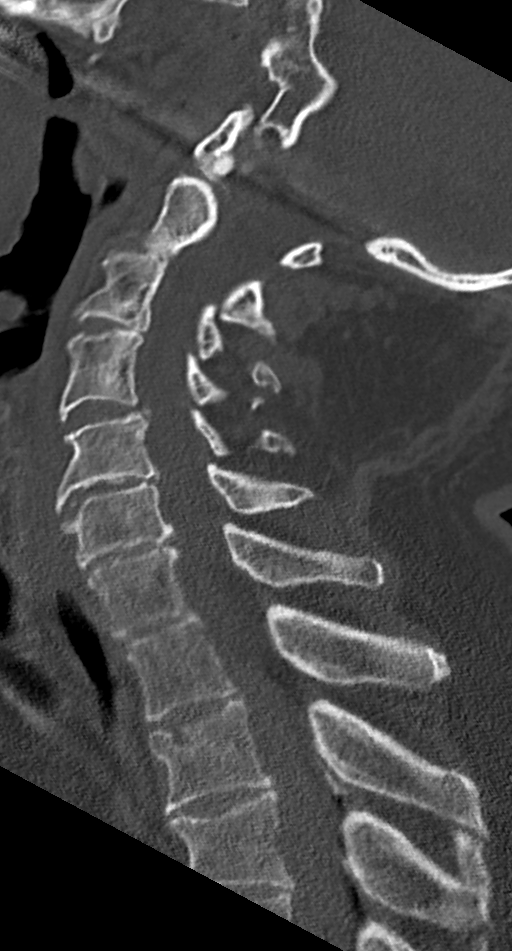
[im 44/75  bone]
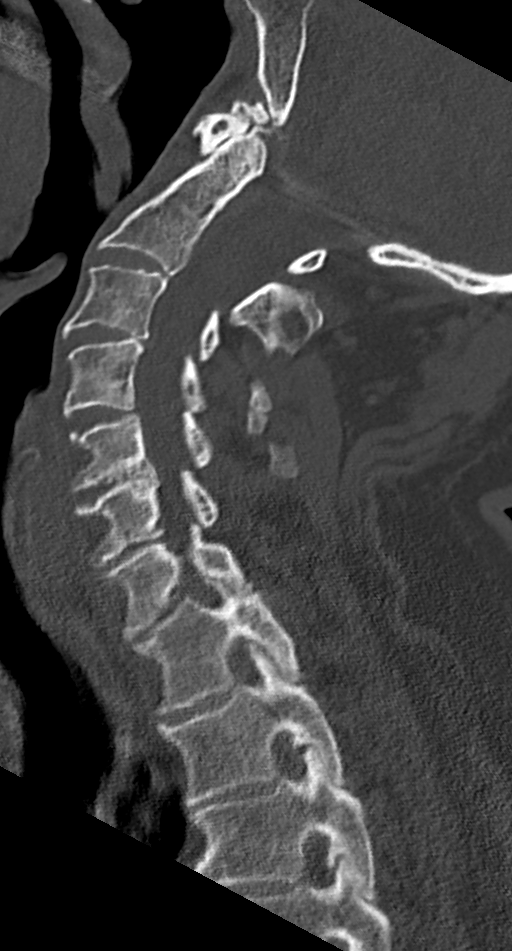
[im 50/75  bone]
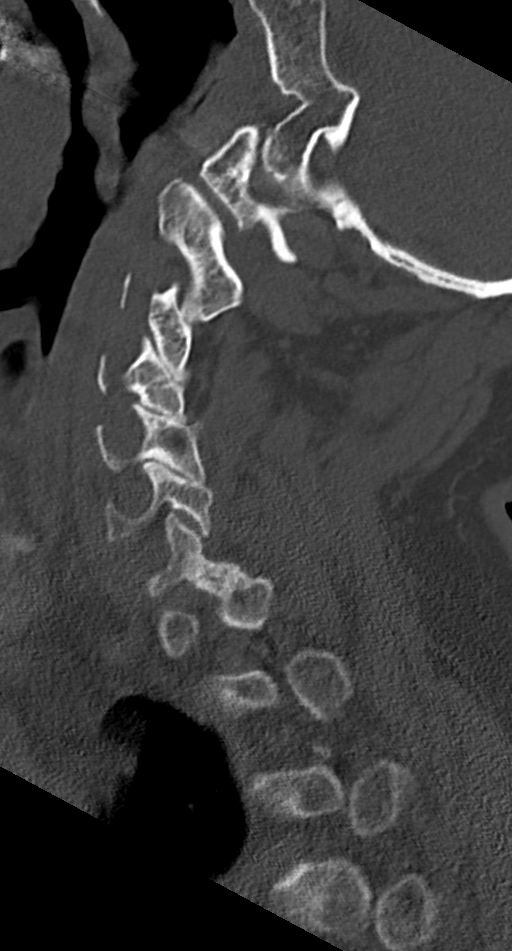

[11 of 33 positions shown; findings below may reference images not displayed]

FINDINGS: Alignment: No significant listhesis.

Skull base and vertebrae: No acute cervical spine fracture. Cervical
vertebral body heights are maintained.

Soft tissues and spinal canal: No prevertebral fluid or swelling. No
visible canal hematoma.

Disc levels: Multilevel degenerative changes are present including
disc space narrowing, endplate osteophytes, and facet and
uncovertebral hypertrophy. Calcified disc protrusions at C3-C4 and
C4-C5 contact the ventral cord. Multilevel foraminal stenosis.

Upper chest: Included lung apices are clear.

Other: Unremarkable.
IMPRESSION: No acute cervical spine fracture.

## 2022-07-02 DIAGNOSIS — Z7189 Other specified counseling: Secondary | ICD-10-CM | POA: Diagnosis not present

## 2022-07-02 DIAGNOSIS — R5383 Other fatigue: Secondary | ICD-10-CM | POA: Diagnosis not present

## 2022-07-02 DIAGNOSIS — Z299 Encounter for prophylactic measures, unspecified: Secondary | ICD-10-CM | POA: Diagnosis not present

## 2022-07-02 DIAGNOSIS — M549 Dorsalgia, unspecified: Secondary | ICD-10-CM | POA: Diagnosis not present

## 2022-07-02 DIAGNOSIS — Z79899 Other long term (current) drug therapy: Secondary | ICD-10-CM | POA: Diagnosis not present

## 2022-07-02 DIAGNOSIS — Z0000 Encounter for general adult medical examination without abnormal findings: Secondary | ICD-10-CM | POA: Diagnosis not present

## 2022-08-04 DIAGNOSIS — G252 Other specified forms of tremor: Secondary | ICD-10-CM | POA: Diagnosis not present

## 2022-08-04 DIAGNOSIS — Z7982 Long term (current) use of aspirin: Secondary | ICD-10-CM | POA: Diagnosis not present

## 2022-08-04 DIAGNOSIS — Z86711 Personal history of pulmonary embolism: Secondary | ICD-10-CM | POA: Diagnosis not present

## 2022-08-04 DIAGNOSIS — S22080S Wedge compression fracture of T11-T12 vertebra, sequela: Secondary | ICD-10-CM | POA: Diagnosis not present

## 2022-08-04 DIAGNOSIS — R2689 Other abnormalities of gait and mobility: Secondary | ICD-10-CM | POA: Diagnosis not present

## 2022-08-04 DIAGNOSIS — Z86718 Personal history of other venous thrombosis and embolism: Secondary | ICD-10-CM | POA: Diagnosis not present

## 2022-08-04 DIAGNOSIS — G309 Alzheimer's disease, unspecified: Secondary | ICD-10-CM | POA: Diagnosis not present

## 2022-08-10 DIAGNOSIS — G309 Alzheimer's disease, unspecified: Secondary | ICD-10-CM | POA: Diagnosis not present

## 2022-08-10 DIAGNOSIS — Z86718 Personal history of other venous thrombosis and embolism: Secondary | ICD-10-CM | POA: Diagnosis not present

## 2022-08-10 DIAGNOSIS — Z86711 Personal history of pulmonary embolism: Secondary | ICD-10-CM | POA: Diagnosis not present

## 2022-08-10 DIAGNOSIS — Z7982 Long term (current) use of aspirin: Secondary | ICD-10-CM | POA: Diagnosis not present

## 2022-08-10 DIAGNOSIS — G252 Other specified forms of tremor: Secondary | ICD-10-CM | POA: Diagnosis not present

## 2022-08-10 DIAGNOSIS — S22080S Wedge compression fracture of T11-T12 vertebra, sequela: Secondary | ICD-10-CM | POA: Diagnosis not present

## 2022-08-10 DIAGNOSIS — R2689 Other abnormalities of gait and mobility: Secondary | ICD-10-CM | POA: Diagnosis not present

## 2022-08-12 DIAGNOSIS — Z7982 Long term (current) use of aspirin: Secondary | ICD-10-CM | POA: Diagnosis not present

## 2022-08-12 DIAGNOSIS — R2689 Other abnormalities of gait and mobility: Secondary | ICD-10-CM | POA: Diagnosis not present

## 2022-08-12 DIAGNOSIS — S22080S Wedge compression fracture of T11-T12 vertebra, sequela: Secondary | ICD-10-CM | POA: Diagnosis not present

## 2022-08-12 DIAGNOSIS — G309 Alzheimer's disease, unspecified: Secondary | ICD-10-CM | POA: Diagnosis not present

## 2022-08-12 DIAGNOSIS — Z86711 Personal history of pulmonary embolism: Secondary | ICD-10-CM | POA: Diagnosis not present

## 2022-08-12 DIAGNOSIS — Z86718 Personal history of other venous thrombosis and embolism: Secondary | ICD-10-CM | POA: Diagnosis not present

## 2022-08-12 DIAGNOSIS — G252 Other specified forms of tremor: Secondary | ICD-10-CM | POA: Diagnosis not present

## 2022-08-13 DIAGNOSIS — G309 Alzheimer's disease, unspecified: Secondary | ICD-10-CM | POA: Diagnosis not present

## 2022-08-13 DIAGNOSIS — S22080S Wedge compression fracture of T11-T12 vertebra, sequela: Secondary | ICD-10-CM | POA: Diagnosis not present

## 2022-08-13 DIAGNOSIS — R2689 Other abnormalities of gait and mobility: Secondary | ICD-10-CM | POA: Diagnosis not present

## 2022-08-17 DIAGNOSIS — Z7982 Long term (current) use of aspirin: Secondary | ICD-10-CM | POA: Diagnosis not present

## 2022-08-17 DIAGNOSIS — G309 Alzheimer's disease, unspecified: Secondary | ICD-10-CM | POA: Diagnosis not present

## 2022-08-17 DIAGNOSIS — R2689 Other abnormalities of gait and mobility: Secondary | ICD-10-CM | POA: Diagnosis not present

## 2022-08-17 DIAGNOSIS — S22080S Wedge compression fracture of T11-T12 vertebra, sequela: Secondary | ICD-10-CM | POA: Diagnosis not present

## 2022-08-17 DIAGNOSIS — G252 Other specified forms of tremor: Secondary | ICD-10-CM | POA: Diagnosis not present

## 2022-08-17 DIAGNOSIS — Z86718 Personal history of other venous thrombosis and embolism: Secondary | ICD-10-CM | POA: Diagnosis not present

## 2022-08-17 DIAGNOSIS — Z86711 Personal history of pulmonary embolism: Secondary | ICD-10-CM | POA: Diagnosis not present

## 2022-08-19 DIAGNOSIS — Z86718 Personal history of other venous thrombosis and embolism: Secondary | ICD-10-CM | POA: Diagnosis not present

## 2022-08-19 DIAGNOSIS — G252 Other specified forms of tremor: Secondary | ICD-10-CM | POA: Diagnosis not present

## 2022-08-19 DIAGNOSIS — S22080S Wedge compression fracture of T11-T12 vertebra, sequela: Secondary | ICD-10-CM | POA: Diagnosis not present

## 2022-08-19 DIAGNOSIS — G309 Alzheimer's disease, unspecified: Secondary | ICD-10-CM | POA: Diagnosis not present

## 2022-08-19 DIAGNOSIS — Z86711 Personal history of pulmonary embolism: Secondary | ICD-10-CM | POA: Diagnosis not present

## 2022-08-19 DIAGNOSIS — Z7982 Long term (current) use of aspirin: Secondary | ICD-10-CM | POA: Diagnosis not present

## 2022-08-19 DIAGNOSIS — R2689 Other abnormalities of gait and mobility: Secondary | ICD-10-CM | POA: Diagnosis not present

## 2022-09-02 DIAGNOSIS — Z86711 Personal history of pulmonary embolism: Secondary | ICD-10-CM | POA: Diagnosis not present

## 2022-09-02 DIAGNOSIS — F325 Major depressive disorder, single episode, in full remission: Secondary | ICD-10-CM | POA: Diagnosis not present

## 2022-09-02 DIAGNOSIS — G309 Alzheimer's disease, unspecified: Secondary | ICD-10-CM | POA: Diagnosis not present

## 2022-09-02 DIAGNOSIS — R2689 Other abnormalities of gait and mobility: Secondary | ICD-10-CM | POA: Diagnosis not present

## 2022-09-02 DIAGNOSIS — Z7982 Long term (current) use of aspirin: Secondary | ICD-10-CM | POA: Diagnosis not present

## 2022-09-02 DIAGNOSIS — G252 Other specified forms of tremor: Secondary | ICD-10-CM | POA: Diagnosis not present

## 2022-09-02 DIAGNOSIS — N400 Enlarged prostate without lower urinary tract symptoms: Secondary | ICD-10-CM | POA: Diagnosis not present

## 2022-09-02 DIAGNOSIS — F0390 Unspecified dementia without behavioral disturbance: Secondary | ICD-10-CM | POA: Diagnosis not present

## 2022-09-08 DIAGNOSIS — R2689 Other abnormalities of gait and mobility: Secondary | ICD-10-CM | POA: Diagnosis not present

## 2022-09-08 DIAGNOSIS — G252 Other specified forms of tremor: Secondary | ICD-10-CM | POA: Diagnosis not present

## 2022-09-08 DIAGNOSIS — G309 Alzheimer's disease, unspecified: Secondary | ICD-10-CM | POA: Diagnosis not present

## 2022-09-08 DIAGNOSIS — F325 Major depressive disorder, single episode, in full remission: Secondary | ICD-10-CM | POA: Diagnosis not present

## 2022-09-08 DIAGNOSIS — F0390 Unspecified dementia without behavioral disturbance: Secondary | ICD-10-CM | POA: Diagnosis not present

## 2022-09-09 DIAGNOSIS — Z86711 Personal history of pulmonary embolism: Secondary | ICD-10-CM | POA: Diagnosis not present

## 2022-09-09 DIAGNOSIS — G252 Other specified forms of tremor: Secondary | ICD-10-CM | POA: Diagnosis not present

## 2022-09-09 DIAGNOSIS — R2689 Other abnormalities of gait and mobility: Secondary | ICD-10-CM | POA: Diagnosis not present

## 2022-09-09 DIAGNOSIS — F325 Major depressive disorder, single episode, in full remission: Secondary | ICD-10-CM | POA: Diagnosis not present

## 2022-09-09 DIAGNOSIS — Z7982 Long term (current) use of aspirin: Secondary | ICD-10-CM | POA: Diagnosis not present

## 2022-09-09 DIAGNOSIS — F0390 Unspecified dementia without behavioral disturbance: Secondary | ICD-10-CM | POA: Diagnosis not present

## 2022-09-09 DIAGNOSIS — G309 Alzheimer's disease, unspecified: Secondary | ICD-10-CM | POA: Diagnosis not present

## 2022-09-09 DIAGNOSIS — N400 Enlarged prostate without lower urinary tract symptoms: Secondary | ICD-10-CM | POA: Diagnosis not present

## 2022-09-12 DIAGNOSIS — I2699 Other pulmonary embolism without acute cor pulmonale: Secondary | ICD-10-CM | POA: Diagnosis not present

## 2022-09-12 DIAGNOSIS — I499 Cardiac arrhythmia, unspecified: Secondary | ICD-10-CM | POA: Diagnosis not present

## 2022-09-12 DIAGNOSIS — I2782 Chronic pulmonary embolism: Secondary | ICD-10-CM | POA: Diagnosis not present

## 2022-09-12 DIAGNOSIS — Z20822 Contact with and (suspected) exposure to covid-19: Secondary | ICD-10-CM | POA: Diagnosis not present

## 2022-09-12 DIAGNOSIS — Z7901 Long term (current) use of anticoagulants: Secondary | ICD-10-CM | POA: Diagnosis not present

## 2022-09-12 DIAGNOSIS — J90 Pleural effusion, not elsewhere classified: Secondary | ICD-10-CM | POA: Diagnosis not present

## 2022-09-12 DIAGNOSIS — R0602 Shortness of breath: Secondary | ICD-10-CM | POA: Diagnosis not present

## 2022-09-12 DIAGNOSIS — R069 Unspecified abnormalities of breathing: Secondary | ICD-10-CM | POA: Diagnosis not present

## 2022-09-12 DIAGNOSIS — R079 Chest pain, unspecified: Secondary | ICD-10-CM | POA: Diagnosis not present

## 2022-09-12 DIAGNOSIS — J9811 Atelectasis: Secondary | ICD-10-CM | POA: Diagnosis not present

## 2022-09-12 DIAGNOSIS — R0789 Other chest pain: Secondary | ICD-10-CM | POA: Diagnosis not present

## 2022-09-12 DIAGNOSIS — Z1152 Encounter for screening for COVID-19: Secondary | ICD-10-CM | POA: Diagnosis not present

## 2022-09-12 DIAGNOSIS — D693 Immune thrombocytopenic purpura: Secondary | ICD-10-CM | POA: Diagnosis not present

## 2022-09-12 DIAGNOSIS — Z743 Need for continuous supervision: Secondary | ICD-10-CM | POA: Diagnosis not present

## 2022-09-15 DIAGNOSIS — N400 Enlarged prostate without lower urinary tract symptoms: Secondary | ICD-10-CM | POA: Diagnosis not present

## 2022-09-15 DIAGNOSIS — Z7982 Long term (current) use of aspirin: Secondary | ICD-10-CM | POA: Diagnosis not present

## 2022-09-15 DIAGNOSIS — R2689 Other abnormalities of gait and mobility: Secondary | ICD-10-CM | POA: Diagnosis not present

## 2022-09-15 DIAGNOSIS — G252 Other specified forms of tremor: Secondary | ICD-10-CM | POA: Diagnosis not present

## 2022-09-15 DIAGNOSIS — F325 Major depressive disorder, single episode, in full remission: Secondary | ICD-10-CM | POA: Diagnosis not present

## 2022-09-15 DIAGNOSIS — G309 Alzheimer's disease, unspecified: Secondary | ICD-10-CM | POA: Diagnosis not present

## 2022-09-15 DIAGNOSIS — Z86711 Personal history of pulmonary embolism: Secondary | ICD-10-CM | POA: Diagnosis not present

## 2022-09-15 DIAGNOSIS — F0390 Unspecified dementia without behavioral disturbance: Secondary | ICD-10-CM | POA: Diagnosis not present

## 2022-09-18 DIAGNOSIS — F325 Major depressive disorder, single episode, in full remission: Secondary | ICD-10-CM | POA: Diagnosis not present

## 2022-09-18 DIAGNOSIS — G252 Other specified forms of tremor: Secondary | ICD-10-CM | POA: Diagnosis not present

## 2022-09-18 DIAGNOSIS — Z86711 Personal history of pulmonary embolism: Secondary | ICD-10-CM | POA: Diagnosis not present

## 2022-09-18 DIAGNOSIS — G309 Alzheimer's disease, unspecified: Secondary | ICD-10-CM | POA: Diagnosis not present

## 2022-09-18 DIAGNOSIS — R2689 Other abnormalities of gait and mobility: Secondary | ICD-10-CM | POA: Diagnosis not present

## 2022-09-18 DIAGNOSIS — N400 Enlarged prostate without lower urinary tract symptoms: Secondary | ICD-10-CM | POA: Diagnosis not present

## 2022-09-18 DIAGNOSIS — F0390 Unspecified dementia without behavioral disturbance: Secondary | ICD-10-CM | POA: Diagnosis not present

## 2022-09-18 DIAGNOSIS — Z7982 Long term (current) use of aspirin: Secondary | ICD-10-CM | POA: Diagnosis not present

## 2022-09-22 DIAGNOSIS — Z86711 Personal history of pulmonary embolism: Secondary | ICD-10-CM | POA: Diagnosis not present

## 2022-09-22 DIAGNOSIS — G309 Alzheimer's disease, unspecified: Secondary | ICD-10-CM | POA: Diagnosis not present

## 2022-09-22 DIAGNOSIS — F0390 Unspecified dementia without behavioral disturbance: Secondary | ICD-10-CM | POA: Diagnosis not present

## 2022-09-22 DIAGNOSIS — N400 Enlarged prostate without lower urinary tract symptoms: Secondary | ICD-10-CM | POA: Diagnosis not present

## 2022-09-22 DIAGNOSIS — R2689 Other abnormalities of gait and mobility: Secondary | ICD-10-CM | POA: Diagnosis not present

## 2022-09-22 DIAGNOSIS — F325 Major depressive disorder, single episode, in full remission: Secondary | ICD-10-CM | POA: Diagnosis not present

## 2022-09-22 DIAGNOSIS — Z7982 Long term (current) use of aspirin: Secondary | ICD-10-CM | POA: Diagnosis not present

## 2022-09-22 DIAGNOSIS — G252 Other specified forms of tremor: Secondary | ICD-10-CM | POA: Diagnosis not present

## 2022-09-24 DIAGNOSIS — Z299 Encounter for prophylactic measures, unspecified: Secondary | ICD-10-CM | POA: Diagnosis not present

## 2022-09-24 DIAGNOSIS — I2699 Other pulmonary embolism without acute cor pulmonale: Secondary | ICD-10-CM | POA: Diagnosis not present

## 2022-09-24 DIAGNOSIS — M545 Low back pain, unspecified: Secondary | ICD-10-CM | POA: Diagnosis not present

## 2022-09-25 DIAGNOSIS — Z86711 Personal history of pulmonary embolism: Secondary | ICD-10-CM | POA: Diagnosis not present

## 2022-09-25 DIAGNOSIS — N400 Enlarged prostate without lower urinary tract symptoms: Secondary | ICD-10-CM | POA: Diagnosis not present

## 2022-09-25 DIAGNOSIS — R2689 Other abnormalities of gait and mobility: Secondary | ICD-10-CM | POA: Diagnosis not present

## 2022-09-25 DIAGNOSIS — G309 Alzheimer's disease, unspecified: Secondary | ICD-10-CM | POA: Diagnosis not present

## 2022-09-25 DIAGNOSIS — F0390 Unspecified dementia without behavioral disturbance: Secondary | ICD-10-CM | POA: Diagnosis not present

## 2022-09-25 DIAGNOSIS — G252 Other specified forms of tremor: Secondary | ICD-10-CM | POA: Diagnosis not present

## 2022-09-25 DIAGNOSIS — F325 Major depressive disorder, single episode, in full remission: Secondary | ICD-10-CM | POA: Diagnosis not present

## 2022-09-25 DIAGNOSIS — Z7982 Long term (current) use of aspirin: Secondary | ICD-10-CM | POA: Diagnosis not present

## 2022-09-30 ENCOUNTER — Emergency Department (HOSPITAL_COMMUNITY): Payer: Medicare HMO

## 2022-09-30 ENCOUNTER — Other Ambulatory Visit: Payer: Self-pay

## 2022-09-30 ENCOUNTER — Emergency Department (HOSPITAL_COMMUNITY)
Admission: EM | Admit: 2022-09-30 | Discharge: 2022-09-30 | Disposition: A | Discharge status: Home / Self Care | Payer: Medicare HMO | Attending: Emergency Medicine | Admitting: Emergency Medicine

## 2022-09-30 DIAGNOSIS — S51011A Laceration without foreign body of right elbow, initial encounter: Secondary | ICD-10-CM | POA: Diagnosis not present

## 2022-09-30 DIAGNOSIS — Z7901 Long term (current) use of anticoagulants: Secondary | ICD-10-CM | POA: Diagnosis not present

## 2022-09-30 DIAGNOSIS — S0990XA Unspecified injury of head, initial encounter: Secondary | ICD-10-CM | POA: Diagnosis present

## 2022-09-30 DIAGNOSIS — R9431 Abnormal electrocardiogram [ECG] [EKG]: Secondary | ICD-10-CM | POA: Diagnosis not present

## 2022-09-30 DIAGNOSIS — W19XXXA Unspecified fall, initial encounter: Secondary | ICD-10-CM

## 2022-09-30 DIAGNOSIS — S0993XA Unspecified injury of face, initial encounter: Secondary | ICD-10-CM | POA: Diagnosis not present

## 2022-09-30 DIAGNOSIS — W01198A Fall on same level from slipping, tripping and stumbling with subsequent striking against other object, initial encounter: Secondary | ICD-10-CM | POA: Diagnosis not present

## 2022-09-30 DIAGNOSIS — H04123 Dry eye syndrome of bilateral lacrimal glands: Secondary | ICD-10-CM | POA: Diagnosis not present

## 2022-09-30 DIAGNOSIS — Z043 Encounter for examination and observation following other accident: Secondary | ICD-10-CM | POA: Diagnosis not present

## 2022-09-30 DIAGNOSIS — H43393 Other vitreous opacities, bilateral: Secondary | ICD-10-CM | POA: Diagnosis not present

## 2022-09-30 DIAGNOSIS — S0083XA Contusion of other part of head, initial encounter: Secondary | ICD-10-CM | POA: Diagnosis not present

## 2022-09-30 DIAGNOSIS — H0102A Squamous blepharitis right eye, upper and lower eyelids: Secondary | ICD-10-CM | POA: Diagnosis not present

## 2022-09-30 DIAGNOSIS — H532 Diplopia: Secondary | ICD-10-CM | POA: Diagnosis not present

## 2022-09-30 DIAGNOSIS — S299XXA Unspecified injury of thorax, initial encounter: Secondary | ICD-10-CM | POA: Diagnosis not present

## 2022-09-30 DIAGNOSIS — H0102B Squamous blepharitis left eye, upper and lower eyelids: Secondary | ICD-10-CM | POA: Diagnosis not present

## 2022-09-30 DIAGNOSIS — H2513 Age-related nuclear cataract, bilateral: Secondary | ICD-10-CM | POA: Diagnosis not present

## 2022-09-30 LAB — CBC WITH DIFFERENTIAL/PLATELET
Abs Immature Granulocytes: 0.03 10*3/uL (ref 0.00–0.07)
Basophils Absolute: 0.1 10*3/uL (ref 0.0–0.1)
Basophils Relative: 1 %
Eosinophils Absolute: 0.4 10*3/uL (ref 0.0–0.5)
Eosinophils Relative: 5 %
HCT: 42.5 % (ref 39.0–52.0)
Hemoglobin: 14.6 g/dL (ref 13.0–17.0)
Immature Granulocytes: 0 %
Lymphocytes Relative: 20 %
Lymphs Abs: 1.6 10*3/uL (ref 0.7–4.0)
MCH: 33.1 pg (ref 26.0–34.0)
MCHC: 34.4 g/dL (ref 30.0–36.0)
MCV: 96.4 fL (ref 80.0–100.0)
Monocytes Absolute: 0.6 10*3/uL (ref 0.1–1.0)
Monocytes Relative: 7 %
Neutro Abs: 5.6 10*3/uL (ref 1.7–7.7)
Neutrophils Relative %: 67 %
Platelets: 196 10*3/uL (ref 150–400)
RBC: 4.41 MIL/uL (ref 4.22–5.81)
RDW: 12.4 % (ref 11.5–15.5)
WBC: 8.3 10*3/uL (ref 4.0–10.5)
nRBC: 0 % (ref 0.0–0.2)

## 2022-09-30 LAB — COMPREHENSIVE METABOLIC PANEL
ALT: 17 U/L (ref 0–44)
AST: 21 U/L (ref 15–41)
Albumin: 3.8 g/dL (ref 3.5–5.0)
Alkaline Phosphatase: 63 U/L (ref 38–126)
Anion gap: 10 (ref 5–15)
BUN: 15 mg/dL (ref 8–23)
CO2: 24 mmol/L (ref 22–32)
Calcium: 9.2 mg/dL (ref 8.9–10.3)
Chloride: 104 mmol/L (ref 98–111)
Creatinine, Ser: 1.17 mg/dL (ref 0.61–1.24)
GFR, Estimated: 60 mL/min — ABNORMAL LOW (ref >60)
Glucose, Bld: 100 mg/dL — ABNORMAL HIGH (ref 70–99)
Potassium: 4.2 mmol/L (ref 3.5–5.1)
Sodium: 138 mmol/L (ref 135–145)
Total Bilirubin: 1.5 mg/dL — ABNORMAL HIGH (ref 0.3–1.2)
Total Protein: 7.2 g/dL (ref 6.5–8.1)

## 2022-09-30 LAB — PROTIME-INR
INR: 1.3 — ABNORMAL HIGH (ref 0.8–1.2)
Prothrombin Time: 16.5 seconds — ABNORMAL HIGH (ref 11.4–15.2)

## 2022-09-30 NOTE — Discharge Instructions (Signed)
You may alternate tylenol or ibuprofen to help with your pain.  Your CT of your head, neck did not show any acute findings.  Follow-up with your primary care physician as needed.

## 2022-09-30 NOTE — ED Provider Notes (Signed)
[place] [facility]ERGENCY DEPARTMENT AT [facility] Provider Note   CSN: [id number] Arrival date & time: 09/30/22  1653     History PE on eliquis, BPH No chief complaint on file.   Randy Mason is a 89 y.o. male.  89 y.o male with a PMH of PE on eliquis, BPH presents to the ED via POV with a chief complaint of fall.  History primarily of pain from wife and daughter who were with him during the fall.  Patient was sleeping Dr. Jenean Lindau office, reports that the floor was not very even, he tripped and hit the left side of his body on to the surface, then fell forward landing on his forehead.  He appears to not have lost consciousness according to them, as his eyes were open and he was answering questions.  They helped him stand up and assisted him to sit down in a chair.  He was recently diagnosed at the end of April with pulmonary embolisms bilaterally, he is currently taking Eliquis, has had a previous history of pulmonary embolisms as well.  He has not been sick recently.  Of note, patient also has a prior history of fractures to his neck and back. No other complaints reported.   The history is provided by the patient, the spouse and a relative.       Home Medications Prior to Admission medications   Medication Sig Start Date End Date Taking? Authorizing Provider  apixaban (ELIQUIS) 5 MG TABS tablet Take 2 tablets (10 mg total) by mouth 2 (two) times daily for 13 doses, then take 1 tablet (5mg  total) two times daily. 09/26/20   Noralee Stain, DO  apixaban (ELIQUIS) 5 MG TABS tablet Take 1 tablet (5 mg total) by mouth 2 (two) times daily. 10/02/20   Noralee Stain, DO  Apoaequorin (PREVAGEN) 10 MG CAPS Take 10 mg by mouth daily.    [provider]  cholecalciferol (VITAMIN D3) 25 MCG (1000 UNIT) tablet Take 1,000 Units by mouth daily.    [provider]  Magnesium 200 MG TABS Take 200 mg by mouth daily.    [provider]  OVER THE COUNTER MEDICATION Take  1 tablet by mouth daily. prostagenix    [provider]  OVER THE COUNTER MEDICATION Take 1 tablet by mouth daily. Fungus eliminator toe nail care    [provider]  vitamin C (ASCORBIC ACID) 500 MG tablet Take 500 mg by mouth daily.    [provider]      Allergies    Patient has no known allergies.    Review of Systems   Review of Systems  Constitutional:  Negative for chills and fever.  Respiratory:  Negative for shortness of breath.   Cardiovascular:  Negative for chest pain.  Gastrointestinal:  Negative for abdominal pain, nausea and vomiting.  Genitourinary:  Negative for flank pain.  Skin:  Positive for color change.  Neurological:  Positive for headaches. Negative for dizziness and weakness.  All other systems reviewed and are negative.   Physical Exam Updated Vital Signs BP 134/80   Pulse 72   Temp 98.3 F (36.8 C) (Oral)   Resp (!) 21   Ht 6\' 1"  (1.854 m)   Wt 100 kg   SpO2 97%   BMI 29.09 kg/m  Physical Exam Vitals and nursing note reviewed.  HENT:     Head: Normocephalic.     Comments: Large goose egg to the forehead with some erythema present.  Mouth/Throat:     Mouth: Mucous membranes are dry.  Eyes:     Pupils: Pupils are equal, round, and reactive to light.     Comments: Pupils are equal and reactive.   Neck:     Comments: No midline cervical midline tenderness.  Cardiovascular:     Rate and Rhythm: Normal rate.  Pulmonary:     Effort: Pulmonary effort is normal.     Breath sounds: No wheezing.     Comments: Lungs are clear with no absent lung sounds.  Abdominal:     General: Abdomen is flat.     Palpations: Abdomen is soft.     Tenderness: There is no abdominal tenderness.     Comments: No bruising, no guarding or rebound.   Musculoskeletal:     Right elbow: Laceration present.     Cervical back: Normal range of motion and neck supple.     Right hip: Normal. No tenderness or bony tenderness. Normal range of  motion.     Left hip: Normal. No tenderness or bony tenderness. Normal range of motion.     Comments: Skin tear to the right elbow with bleeding controlled.   Skin:    General: Skin is warm and dry.     Findings: Bruising present.  Neurological:     Mental Status: He is alert.     Comments: Alert, oriented, thought content appropriate. Speech fluent without evidence of aphasia. Able to follow 2 step commands without difficulty.  Cranial Nerves:  II:  Peripheral visual fields grossly normal, pupils, round, reactive to light III,IV, VI: ptosis not present, extra-ocular motions intact bilaterally  V,VII: smile symmetric, facial light touch sensation equal VIII: hearing grossly normal bilaterally  IX,X: midline uvula rise  XI: bilateral shoulder shrug equal and strong XII: midline tongue extension  Motor:  5/5 in upper and lower extremities bilaterally including strong and equal grip strength and dorsiflexion/plantar flexion Sensory: light touch normal in all extremities.  Cerebellar: normal finger-to-nose with bilateral upper extremities, pronator drift negative Gait: standing in the room       ED Results / Procedures / Treatments   Labs (all labs ordered are listed, but only abnormal results are displayed) Labs Reviewed  COMPREHENSIVE METABOLIC PANEL - Abnormal; Notable for the following components:      Result Value   Glucose, Bld 100 (*)    Total Bilirubin 1.5 (*)    GFR, Estimated 60 (*)    All other components within normal limits  PROTIME-INR - Abnormal; Notable for the following components:   Prothrombin Time 16.5 (*)    INR 1.3 (*)    All other components within normal limits  CBC WITH DIFFERENTIAL/PLATELET    EKG EKG Interpretation  Date/Time:  Wednesday Sep 30 2022 17:45:41 EDT Ventricular Rate:  77 PR Interval:  223 QRS Duration: 106 QT Interval:  372 QTC Calculation: 421 R Axis:   38 Text Interpretation: Sinus or ectopic atrial rhythm Prolonged PR  interval Confirmed by Alona Bene [phone]) on 09/30/2022 5:50:48 PM  Radiology CT HEAD WO CONTRAST ( )  Result Date: 09/30/2022 CLINICAL DATA:  Fall EXAM: CT HEAD WITHOUT CONTRAST CT CERVICAL SPINE WITHOUT CONTRAST TECHNIQUE: Multidetector CT imaging of the head and cervical spine was performed following the standard protocol without intravenous contrast. Multiplanar CT image reconstructions of the cervical spine were also generated. RADIATION DOSE REDUCTION: This exam was performed according to the departmental dose-optimization program which includes automated exposure control, adjustment of the mA and/or kV according  to patient size and/or use of iterative reconstruction technique. COMPARISON:  09/26/2020 FINDINGS: CT HEAD FINDINGS Brain: No evidence of acute infarction, hemorrhage, hydrocephalus, extra-axial collection or mass lesion/mass effect. Periventricular white matter hypodensity. Mega cisterna magna variant of the posterior fossa (series 3, image 9). Vascular: No hyperdense vessel or unexpected calcification. Skull: Normal. Negative for fracture or focal lesion. Sinuses/Orbits: No acute finding. Other: None. CT CERVICAL SPINE FINDINGS Alignment: Normal. Skull base and vertebrae: No acute fracture. No primary bone lesion or focal pathologic process. Soft tissues and spinal canal: No prevertebral fluid or swelling. No visible canal hematoma. Disc levels: Mild-to-moderate multilevel disc space height loss and osteophytosis, worst from C5-C7. Upper chest: Negative. Other: None. IMPRESSION: 1. No acute intracranial pathology. Small-vessel white matter disease. 2. No fracture or static subluxation of the cervical spine. 3. Mild-to-moderate multilevel cervical disc degenerative disease. Electronically Signed   By: Jearld Lesch M.D.   On: 09/30/2022 18:34   CT Cervical Spine Wo Contrast  Result Date: 09/30/2022 CLINICAL DATA:  Fall EXAM: CT HEAD WITHOUT CONTRAST CT CERVICAL SPINE WITHOUT CONTRAST  TECHNIQUE: Multidetector CT imaging of the head and cervical spine was performed following the standard protocol without intravenous contrast. Multiplanar CT image reconstructions of the cervical spine were also generated. RADIATION DOSE REDUCTION: This exam was performed according to the departmental dose-optimization program which includes automated exposure control, adjustment of the mA and/or kV according to patient size and/or use of iterative reconstruction technique. COMPARISON:  09/26/2020 FINDINGS: CT HEAD FINDINGS Brain: No evidence of acute infarction, hemorrhage, hydrocephalus, extra-axial collection or mass lesion/mass effect. Periventricular white matter hypodensity. Mega cisterna magna variant of the posterior fossa (series 3, image 9). Vascular: No hyperdense vessel or unexpected calcification. Skull: Normal. Negative for fracture or focal lesion. Sinuses/Orbits: No acute finding. Other: None. CT CERVICAL SPINE FINDINGS Alignment: Normal. Skull base and vertebrae: No acute fracture. No primary bone lesion or focal pathologic process. Soft tissues and spinal canal: No prevertebral fluid or swelling. No visible canal hematoma. Disc levels: Mild-to-moderate multilevel disc space height loss and osteophytosis, worst from C5-C7. Upper chest: Negative. Other: None. IMPRESSION: 1. No acute intracranial pathology. Small-vessel white matter disease. 2. No fracture or static subluxation of the cervical spine. 3. Mild-to-moderate multilevel cervical disc degenerative disease. Electronically Signed   By: Jearld Lesch M.D.   On: 09/30/2022 18:34   DG Chest Portable 1 View  Result Date: 09/30/2022 CLINICAL DATA:  Trauma fall EXAM: PORTABLE CHEST 1 VIEW COMPARISON:  CT 09/12/2022 FINDINGS: No acute airspace disease. Stable cardiomediastinal silhouette. No pneumothorax IMPRESSION: No active disease. Electronically Signed   By: Jasmine Pang M.D.   On: 09/30/2022 17:26    Procedures Procedures     Medications Ordered in ED Medications - No data to display  ED Course/ Medical Decision Making/ A&P                             Medical Decision Making Amount and/or Complexity of Data Reviewed Labs: ordered. Radiology: ordered.   This patient presents to the ED for concern of trauma, this involves a number of treatment options, and is a complaint that carries with it a high risk of complications and morbidity.  The differential diagnosis includes intracranial hemorrhage versus fracture versus syncope.    Co morbidities: Discussed in HPI   Brief History:  See HPI.   EMR reviewed including pt PMHx, past surgical history and past visits to ER.  See HPI for more details   Lab Tests:  I ordered and independently interpreted labs.  The pertinent results include:    I personally reviewed all laboratory work and imaging. Metabolic panel without any acute abnormality specifically kidney function within normal limits and no significant electrolyte abnormalities. CBC without leukocytosis or significant anemia.   Imaging Studies:  Xray of the chest without any acute findings. CT Head/Neck: IMPRESSION:  1. No acute intracranial pathology. Small-vessel white matter  disease.  2. No fracture or static subluxation of the cervical spine.  3. Mild-to-moderate multilevel cervical disc degenerative disease.   Cardiac Monitoring:  The patient was maintained on a cardiac monitor.  I personally viewed and interpreted the cardiac monitored which showed an underlying rhythm of: NSR  EKG non-ischemic  Medicines ordered:  N/A  Reevaluation:  After the interventions noted above I re-evaluated patient and found that they have :improved   Social Determinants of Health:  The patient's social determinants of health were a factor in the care of this patient  Problem List / ED Course:  Patient with a prior history of pulmonary embolisms anticoagulated on Eliquis presents to  the ED status post mechanical fall.  According to daughter along with wife who are at the bedside providing most of the history patient was at Dr. Cipriano Mile office, he tripped on a unbalanced floor.  Reports he landed on his head.  He has not been sick recently, he did recently get diagnosed with pulmonary embolisms and was placed on Eliquis which she is currently taking.  His blood work is unremarkable.  CT of his head, cervical spine, chest x-ray did not show any acute findings.  Patient's vitals are within normal limits, he had a steady gait on arrival to the ED via POV.  We discussed results of his imaging, will treat supportively with Tylenol versus ibuprofen.  Patient is hemodynamically stable for discharge. Dispostion:  After consideration of the diagnostic results and the patients response to treatment, I feel that the patent would benefit from follow up with PCP.     Portions of this note were generated with Scientist, clinical (histocompatibility and immunogenetics). Dictation errors may occur despite best attempts at proofreading.   Final Clinical Impression(s) / ED Diagnoses Final diagnoses:  Fall, initial encounter  Injury of forehead, initial encounter    Rx / DC Orders ED Discharge Orders     None         Claude Manges, Cordelia Poche 09/30/22 2011    Maia Plan, MD 10/03/22 0110

## 2022-09-30 NOTE — Progress Notes (Signed)
   09/30/22 1730  Spiritual Encounters  Type of Visit Declined chaplain visit  Referral source Trauma page  Reason for visit Trauma  OnCall Visit Yes   Chaplain responded to level 2 trauma page. Patient and family declined chaplain visit.   Arlyce Dice, Chaplain Resident [phone]

## 2022-10-01 DIAGNOSIS — F0390 Unspecified dementia without behavioral disturbance: Secondary | ICD-10-CM | POA: Diagnosis not present

## 2022-10-01 DIAGNOSIS — R2689 Other abnormalities of gait and mobility: Secondary | ICD-10-CM | POA: Diagnosis not present

## 2022-10-01 DIAGNOSIS — F325 Major depressive disorder, single episode, in full remission: Secondary | ICD-10-CM | POA: Diagnosis not present

## 2022-10-01 DIAGNOSIS — G252 Other specified forms of tremor: Secondary | ICD-10-CM | POA: Diagnosis not present

## 2022-10-01 DIAGNOSIS — N400 Enlarged prostate without lower urinary tract symptoms: Secondary | ICD-10-CM | POA: Diagnosis not present

## 2022-10-01 DIAGNOSIS — G309 Alzheimer's disease, unspecified: Secondary | ICD-10-CM | POA: Diagnosis not present

## 2022-10-01 DIAGNOSIS — Z86711 Personal history of pulmonary embolism: Secondary | ICD-10-CM | POA: Diagnosis not present

## 2022-10-01 DIAGNOSIS — Z7982 Long term (current) use of aspirin: Secondary | ICD-10-CM | POA: Diagnosis not present

## 2022-10-02 DIAGNOSIS — G309 Alzheimer's disease, unspecified: Secondary | ICD-10-CM | POA: Diagnosis not present

## 2022-10-02 DIAGNOSIS — W19XXXA Unspecified fall, initial encounter: Secondary | ICD-10-CM | POA: Diagnosis not present

## 2022-10-02 DIAGNOSIS — N400 Enlarged prostate without lower urinary tract symptoms: Secondary | ICD-10-CM | POA: Diagnosis not present

## 2022-10-02 DIAGNOSIS — F0390 Unspecified dementia without behavioral disturbance: Secondary | ICD-10-CM | POA: Diagnosis not present

## 2022-10-02 DIAGNOSIS — F325 Major depressive disorder, single episode, in full remission: Secondary | ICD-10-CM | POA: Diagnosis not present

## 2022-10-02 DIAGNOSIS — R2681 Unsteadiness on feet: Secondary | ICD-10-CM | POA: Diagnosis not present

## 2022-10-02 DIAGNOSIS — I2699 Other pulmonary embolism without acute cor pulmonale: Secondary | ICD-10-CM | POA: Diagnosis not present

## 2022-10-02 DIAGNOSIS — Z7982 Long term (current) use of aspirin: Secondary | ICD-10-CM | POA: Diagnosis not present

## 2022-10-02 DIAGNOSIS — R2689 Other abnormalities of gait and mobility: Secondary | ICD-10-CM | POA: Diagnosis not present

## 2022-10-02 DIAGNOSIS — Z86711 Personal history of pulmonary embolism: Secondary | ICD-10-CM | POA: Diagnosis not present

## 2022-10-02 DIAGNOSIS — Z299 Encounter for prophylactic measures, unspecified: Secondary | ICD-10-CM | POA: Diagnosis not present

## 2022-10-02 DIAGNOSIS — G252 Other specified forms of tremor: Secondary | ICD-10-CM | POA: Diagnosis not present

## 2022-10-06 DIAGNOSIS — F0390 Unspecified dementia without behavioral disturbance: Secondary | ICD-10-CM | POA: Diagnosis not present

## 2022-10-06 DIAGNOSIS — G309 Alzheimer's disease, unspecified: Secondary | ICD-10-CM | POA: Diagnosis not present

## 2022-10-06 DIAGNOSIS — G252 Other specified forms of tremor: Secondary | ICD-10-CM | POA: Diagnosis not present

## 2022-10-06 DIAGNOSIS — R2689 Other abnormalities of gait and mobility: Secondary | ICD-10-CM | POA: Diagnosis not present

## 2022-10-06 DIAGNOSIS — N400 Enlarged prostate without lower urinary tract symptoms: Secondary | ICD-10-CM | POA: Diagnosis not present

## 2022-10-06 DIAGNOSIS — Z86711 Personal history of pulmonary embolism: Secondary | ICD-10-CM | POA: Diagnosis not present

## 2022-10-06 DIAGNOSIS — Z7982 Long term (current) use of aspirin: Secondary | ICD-10-CM | POA: Diagnosis not present

## 2022-10-06 DIAGNOSIS — F325 Major depressive disorder, single episode, in full remission: Secondary | ICD-10-CM | POA: Diagnosis not present

## 2022-10-07 ENCOUNTER — Telehealth: Payer: Self-pay | Admitting: *Deleted

## 2022-10-07 NOTE — Telephone Encounter (Signed)
Transition Care Management Unsuccessful Follow-up Telephone Call  Date of discharge and from where:  [place] 09/30/2022  Attempts:  1st Attempt  Reason for unsuccessful TCM follow-up call:  Left voice message

## 2022-10-12 DIAGNOSIS — Z7982 Long term (current) use of aspirin: Secondary | ICD-10-CM | POA: Diagnosis not present

## 2022-10-12 DIAGNOSIS — G309 Alzheimer's disease, unspecified: Secondary | ICD-10-CM | POA: Diagnosis not present

## 2022-10-12 DIAGNOSIS — Z86711 Personal history of pulmonary embolism: Secondary | ICD-10-CM | POA: Diagnosis not present

## 2022-10-12 DIAGNOSIS — F325 Major depressive disorder, single episode, in full remission: Secondary | ICD-10-CM | POA: Diagnosis not present

## 2022-10-12 DIAGNOSIS — G252 Other specified forms of tremor: Secondary | ICD-10-CM | POA: Diagnosis not present

## 2022-10-12 DIAGNOSIS — N400 Enlarged prostate without lower urinary tract symptoms: Secondary | ICD-10-CM | POA: Diagnosis not present

## 2022-10-12 DIAGNOSIS — R2689 Other abnormalities of gait and mobility: Secondary | ICD-10-CM | POA: Diagnosis not present

## 2022-10-12 DIAGNOSIS — F0390 Unspecified dementia without behavioral disturbance: Secondary | ICD-10-CM | POA: Diagnosis not present

## 2022-10-22 DIAGNOSIS — Z86711 Personal history of pulmonary embolism: Secondary | ICD-10-CM | POA: Diagnosis not present

## 2022-10-22 DIAGNOSIS — F0390 Unspecified dementia without behavioral disturbance: Secondary | ICD-10-CM | POA: Diagnosis not present

## 2022-10-22 DIAGNOSIS — N400 Enlarged prostate without lower urinary tract symptoms: Secondary | ICD-10-CM | POA: Diagnosis not present

## 2022-10-22 DIAGNOSIS — F325 Major depressive disorder, single episode, in full remission: Secondary | ICD-10-CM | POA: Diagnosis not present

## 2022-10-22 DIAGNOSIS — R2689 Other abnormalities of gait and mobility: Secondary | ICD-10-CM | POA: Diagnosis not present

## 2022-10-22 DIAGNOSIS — Z7982 Long term (current) use of aspirin: Secondary | ICD-10-CM | POA: Diagnosis not present

## 2022-10-22 DIAGNOSIS — G252 Other specified forms of tremor: Secondary | ICD-10-CM | POA: Diagnosis not present

## 2022-10-22 DIAGNOSIS — G309 Alzheimer's disease, unspecified: Secondary | ICD-10-CM | POA: Diagnosis not present

## 2022-10-29 DIAGNOSIS — G309 Alzheimer's disease, unspecified: Secondary | ICD-10-CM | POA: Diagnosis not present

## 2022-10-29 DIAGNOSIS — Z7982 Long term (current) use of aspirin: Secondary | ICD-10-CM | POA: Diagnosis not present

## 2022-10-29 DIAGNOSIS — G252 Other specified forms of tremor: Secondary | ICD-10-CM | POA: Diagnosis not present

## 2022-10-29 DIAGNOSIS — F325 Major depressive disorder, single episode, in full remission: Secondary | ICD-10-CM | POA: Diagnosis not present

## 2022-10-29 DIAGNOSIS — F0390 Unspecified dementia without behavioral disturbance: Secondary | ICD-10-CM | POA: Diagnosis not present

## 2022-10-29 DIAGNOSIS — Z86711 Personal history of pulmonary embolism: Secondary | ICD-10-CM | POA: Diagnosis not present

## 2022-10-29 DIAGNOSIS — N400 Enlarged prostate without lower urinary tract symptoms: Secondary | ICD-10-CM | POA: Diagnosis not present

## 2022-10-29 DIAGNOSIS — R2689 Other abnormalities of gait and mobility: Secondary | ICD-10-CM | POA: Diagnosis not present

## 2023-01-18 DIAGNOSIS — H524 Presbyopia: Secondary | ICD-10-CM | POA: Diagnosis not present

## 2023-01-31 DIAGNOSIS — S0990XA Unspecified injury of head, initial encounter: Secondary | ICD-10-CM | POA: Diagnosis not present

## 2023-01-31 DIAGNOSIS — S0003XA Contusion of scalp, initial encounter: Secondary | ICD-10-CM | POA: Diagnosis not present

## 2023-01-31 DIAGNOSIS — Z043 Encounter for examination and observation following other accident: Secondary | ICD-10-CM | POA: Diagnosis not present

## 2023-01-31 DIAGNOSIS — S0093XA Contusion of unspecified part of head, initial encounter: Secondary | ICD-10-CM | POA: Diagnosis not present

## 2023-01-31 DIAGNOSIS — Z23 Encounter for immunization: Secondary | ICD-10-CM | POA: Diagnosis not present

## 2023-01-31 DIAGNOSIS — H9190 Unspecified hearing loss, unspecified ear: Secondary | ICD-10-CM | POA: Diagnosis not present

## 2023-01-31 DIAGNOSIS — W182XXA Fall in (into) shower or empty bathtub, initial encounter: Secondary | ICD-10-CM | POA: Diagnosis not present

## 2023-02-11 DIAGNOSIS — G309 Alzheimer's disease, unspecified: Secondary | ICD-10-CM | POA: Diagnosis not present

## 2023-02-11 DIAGNOSIS — Z23 Encounter for immunization: Secondary | ICD-10-CM | POA: Diagnosis not present

## 2023-02-11 DIAGNOSIS — Z299 Encounter for prophylactic measures, unspecified: Secondary | ICD-10-CM | POA: Diagnosis not present

## 2023-02-11 DIAGNOSIS — I2699 Other pulmonary embolism without acute cor pulmonale: Secondary | ICD-10-CM | POA: Diagnosis not present

## 2023-02-11 DIAGNOSIS — R972 Elevated prostate specific antigen [PSA]: Secondary | ICD-10-CM | POA: Diagnosis not present

## 2023-02-11 DIAGNOSIS — Z125 Encounter for screening for malignant neoplasm of prostate: Secondary | ICD-10-CM | POA: Diagnosis not present

## 2023-02-11 DIAGNOSIS — Z0000 Encounter for general adult medical examination without abnormal findings: Secondary | ICD-10-CM | POA: Diagnosis not present

## 2023-02-11 DIAGNOSIS — F0280 Dementia in other diseases classified elsewhere without behavioral disturbance: Secondary | ICD-10-CM | POA: Diagnosis not present

## 2023-04-17 DIAGNOSIS — Z6829 Body mass index (BMI) 29.0-29.9, adult: Secondary | ICD-10-CM | POA: Diagnosis not present

## 2023-04-17 DIAGNOSIS — N390 Urinary tract infection, site not specified: Secondary | ICD-10-CM | POA: Diagnosis not present

## 2023-04-17 DIAGNOSIS — N3001 Acute cystitis with hematuria: Secondary | ICD-10-CM | POA: Diagnosis not present

## 2023-04-17 DIAGNOSIS — E663 Overweight: Secondary | ICD-10-CM | POA: Diagnosis not present

## 2023-04-17 DIAGNOSIS — N10 Acute tubulo-interstitial nephritis: Secondary | ICD-10-CM | POA: Diagnosis not present

## 2023-04-17 DIAGNOSIS — N200 Calculus of kidney: Secondary | ICD-10-CM | POA: Diagnosis not present

## 2023-04-17 DIAGNOSIS — N201 Calculus of ureter: Secondary | ICD-10-CM | POA: Diagnosis not present

## 2023-04-17 DIAGNOSIS — R030 Elevated blood-pressure reading, without diagnosis of hypertension: Secondary | ICD-10-CM | POA: Diagnosis not present

## 2023-04-23 DIAGNOSIS — Z299 Encounter for prophylactic measures, unspecified: Secondary | ICD-10-CM | POA: Diagnosis not present

## 2023-04-23 DIAGNOSIS — R319 Hematuria, unspecified: Secondary | ICD-10-CM | POA: Diagnosis not present

## 2023-04-23 DIAGNOSIS — N390 Urinary tract infection, site not specified: Secondary | ICD-10-CM | POA: Diagnosis not present

## 2023-04-23 DIAGNOSIS — Z86711 Personal history of pulmonary embolism: Secondary | ICD-10-CM | POA: Diagnosis not present

## 2023-04-30 DIAGNOSIS — F0280 Dementia in other diseases classified elsewhere without behavioral disturbance: Secondary | ICD-10-CM | POA: Diagnosis not present

## 2023-04-30 DIAGNOSIS — N400 Enlarged prostate without lower urinary tract symptoms: Secondary | ICD-10-CM | POA: Diagnosis not present

## 2023-04-30 DIAGNOSIS — Z299 Encounter for prophylactic measures, unspecified: Secondary | ICD-10-CM | POA: Diagnosis not present

## 2023-04-30 DIAGNOSIS — R319 Hematuria, unspecified: Secondary | ICD-10-CM | POA: Diagnosis not present

## 2023-04-30 DIAGNOSIS — G309 Alzheimer's disease, unspecified: Secondary | ICD-10-CM | POA: Diagnosis not present

## 2023-12-09 DIAGNOSIS — M47814 Spondylosis without myelopathy or radiculopathy, thoracic region: Secondary | ICD-10-CM | POA: Diagnosis not present

## 2023-12-09 DIAGNOSIS — M47812 Spondylosis without myelopathy or radiculopathy, cervical region: Secondary | ICD-10-CM | POA: Diagnosis not present

## 2023-12-09 DIAGNOSIS — Z86711 Personal history of pulmonary embolism: Secondary | ICD-10-CM | POA: Diagnosis not present

## 2023-12-09 DIAGNOSIS — M19041 Primary osteoarthritis, right hand: Secondary | ICD-10-CM | POA: Diagnosis not present

## 2023-12-09 DIAGNOSIS — S61320A Laceration with foreign body of right index finger with damage to nail, initial encounter: Secondary | ICD-10-CM | POA: Diagnosis not present

## 2023-12-09 DIAGNOSIS — S61216A Laceration without foreign body of right little finger without damage to nail, initial encounter: Secondary | ICD-10-CM | POA: Diagnosis not present

## 2023-12-09 DIAGNOSIS — S0083XA Contusion of other part of head, initial encounter: Secondary | ICD-10-CM | POA: Diagnosis not present

## 2023-12-09 DIAGNOSIS — S6991XA Unspecified injury of right wrist, hand and finger(s), initial encounter: Secondary | ICD-10-CM | POA: Diagnosis not present

## 2023-12-09 DIAGNOSIS — F0390 Unspecified dementia without behavioral disturbance: Secondary | ICD-10-CM | POA: Diagnosis not present

## 2023-12-09 DIAGNOSIS — W1839XA Other fall on same level, initial encounter: Secondary | ICD-10-CM | POA: Diagnosis not present

## 2023-12-09 DIAGNOSIS — S0990XA Unspecified injury of head, initial encounter: Secondary | ICD-10-CM | POA: Diagnosis not present

## 2023-12-09 DIAGNOSIS — S0003XA Contusion of scalp, initial encounter: Secondary | ICD-10-CM | POA: Diagnosis not present

## 2023-12-17 DIAGNOSIS — S61411D Laceration without foreign body of right hand, subsequent encounter: Secondary | ICD-10-CM | POA: Diagnosis not present

## 2023-12-17 DIAGNOSIS — S61316A Laceration without foreign body of right little finger with damage to nail, initial encounter: Secondary | ICD-10-CM | POA: Diagnosis not present

## 2023-12-17 DIAGNOSIS — X58XXXA Exposure to other specified factors, initial encounter: Secondary | ICD-10-CM | POA: Diagnosis not present

## 2023-12-17 DIAGNOSIS — Z86711 Personal history of pulmonary embolism: Secondary | ICD-10-CM | POA: Diagnosis not present

## 2023-12-31 DIAGNOSIS — S22069A Unspecified fracture of T7-T8 vertebra, initial encounter for closed fracture: Secondary | ICD-10-CM | POA: Diagnosis not present

## 2023-12-31 DIAGNOSIS — Z299 Encounter for prophylactic measures, unspecified: Secondary | ICD-10-CM | POA: Diagnosis not present

## 2023-12-31 DIAGNOSIS — Z7189 Other specified counseling: Secondary | ICD-10-CM | POA: Diagnosis not present

## 2023-12-31 DIAGNOSIS — Z86711 Personal history of pulmonary embolism: Secondary | ICD-10-CM | POA: Diagnosis not present

## 2023-12-31 DIAGNOSIS — G309 Alzheimer's disease, unspecified: Secondary | ICD-10-CM | POA: Diagnosis not present

## 2023-12-31 DIAGNOSIS — Z1331 Encounter for screening for depression: Secondary | ICD-10-CM | POA: Diagnosis not present

## 2023-12-31 DIAGNOSIS — Z1339 Encounter for screening examination for other mental health and behavioral disorders: Secondary | ICD-10-CM | POA: Diagnosis not present

## 2023-12-31 DIAGNOSIS — Z0000 Encounter for general adult medical examination without abnormal findings: Secondary | ICD-10-CM | POA: Diagnosis not present

## 2024-03-09 DIAGNOSIS — G309 Alzheimer's disease, unspecified: Secondary | ICD-10-CM | POA: Diagnosis not present

## 2024-03-09 DIAGNOSIS — Z299 Encounter for prophylactic measures, unspecified: Secondary | ICD-10-CM | POA: Diagnosis not present

## 2024-03-09 DIAGNOSIS — Z0000 Encounter for general adult medical examination without abnormal findings: Secondary | ICD-10-CM | POA: Diagnosis not present

## 2024-03-09 DIAGNOSIS — F0280 Dementia in other diseases classified elsewhere without behavioral disturbance: Secondary | ICD-10-CM | POA: Diagnosis not present

## 2024-03-09 DIAGNOSIS — S12000A Unspecified displaced fracture of first cervical vertebra, initial encounter for closed fracture: Secondary | ICD-10-CM | POA: Diagnosis not present

## 2024-04-12 DIAGNOSIS — H532 Diplopia: Secondary | ICD-10-CM | POA: Diagnosis not present

## 2024-04-12 DIAGNOSIS — H43393 Other vitreous opacities, bilateral: Secondary | ICD-10-CM | POA: Diagnosis not present

## 2024-04-12 DIAGNOSIS — H0102A Squamous blepharitis right eye, upper and lower eyelids: Secondary | ICD-10-CM | POA: Diagnosis not present

## 2024-04-12 DIAGNOSIS — H04123 Dry eye syndrome of bilateral lacrimal glands: Secondary | ICD-10-CM | POA: Diagnosis not present

## 2024-04-12 DIAGNOSIS — H0102B Squamous blepharitis left eye, upper and lower eyelids: Secondary | ICD-10-CM | POA: Diagnosis not present

## 2024-04-12 DIAGNOSIS — H2513 Age-related nuclear cataract, bilateral: Secondary | ICD-10-CM | POA: Diagnosis not present

## 2024-04-12 DIAGNOSIS — H5051 Esophoria: Secondary | ICD-10-CM | POA: Diagnosis not present
